# Patient Record
Sex: Female | Born: 1985 | Race: Asian | Hispanic: No | Marital: Married | Smoking: Never smoker
Health system: Southern US, Community
[De-identification: ages and names within clinical notes are randomized; demographics above are authoritative.]

---

## 2018-12-12 ENCOUNTER — Other Ambulatory Visit (HOSPITAL_COMMUNITY)
Admission: RE | Admit: 2018-12-12 | Discharge: 2018-12-12 | Disposition: A | Discharge status: Home / Self Care | Payer: 59 | Source: Ambulatory Visit | Attending: Obstetrics & Gynecology | Admitting: Obstetrics & Gynecology

## 2018-12-12 DIAGNOSIS — O3680X9 Pregnancy with inconclusive fetal viability, other fetus: Secondary | ICD-10-CM | POA: Insufficient documentation

## 2018-12-12 LAB — HCG, QUANTITATIVE, PREGNANCY: hCG, Beta Chain, Quant, S: 1524 m[IU]/mL — ABNORMAL HIGH (ref <5)

## 2019-05-31 LAB — OB RESULTS CONSOLE ABO/RH: RH Type: POSITIVE

## 2019-05-31 LAB — OB RESULTS CONSOLE GC/CHLAMYDIA
Chlamydia: NEGATIVE
Gonorrhea: NEGATIVE

## 2019-05-31 LAB — OB RESULTS CONSOLE ANTIBODY SCREEN: Antibody Screen: NEGATIVE

## 2019-05-31 LAB — OB RESULTS CONSOLE PLATELET COUNT: Platelets: 260

## 2019-05-31 LAB — HEMOGLOBIN EVAL RFX ELECTROPHORESIS: Pap: NEGATIVE

## 2019-05-31 LAB — OB RESULTS CONSOLE HGB/HCT, BLOOD
HCT: 39 (ref 29–41)
Hemoglobin: 12.5

## 2019-05-31 LAB — OB RESULTS CONSOLE HEPATITIS B SURFACE ANTIGEN: Hepatitis B Surface Ag: NEGATIVE

## 2019-05-31 LAB — OB RESULTS CONSOLE RPR: RPR: NONREACTIVE

## 2019-05-31 LAB — OB RESULTS CONSOLE HIV ANTIBODY (ROUTINE TESTING): HIV: NONREACTIVE

## 2019-05-31 LAB — OB RESULTS CONSOLE VARICELLA ZOSTER ANTIBODY, IGG: Varicella: IMMUNE

## 2019-05-31 LAB — OB RESULTS CONSOLE RUBELLA ANTIBODY, IGM: Rubella: IMMUNE

## 2019-09-29 LAB — OB RESULTS CONSOLE RPR: RPR: NONREACTIVE

## 2019-09-29 LAB — OB RESULTS CONSOLE HIV ANTIBODY (ROUTINE TESTING): HIV: NONREACTIVE

## 2019-09-29 LAB — OB RESULTS CONSOLE HGB/HCT, BLOOD
HCT: 33 (ref 29–41)
Hemoglobin: 10.7

## 2019-09-29 LAB — OB RESULTS CONSOLE PLATELET COUNT: Platelets: 259

## 2019-09-30 DIAGNOSIS — O24410 Gestational diabetes mellitus in pregnancy, diet controlled: Secondary | ICD-10-CM

## 2019-09-30 HISTORY — DX: Gestational diabetes mellitus in pregnancy, diet controlled: O24.410

## 2019-11-04 ENCOUNTER — Ambulatory Visit: Payer: 59 | Admitting: Registered"

## 2019-11-10 ENCOUNTER — Other Ambulatory Visit: Payer: Self-pay

## 2019-11-10 ENCOUNTER — Encounter: Payer: 59 | Attending: Obstetrics and Gynecology | Admitting: Dietician

## 2019-11-10 ENCOUNTER — Encounter: Payer: Self-pay | Admitting: Dietician

## 2019-11-10 DIAGNOSIS — R7309 Other abnormal glucose: Secondary | ICD-10-CM | POA: Diagnosis present

## 2019-11-10 NOTE — Patient Instructions (Signed)
   Stick to no more than 3-4 servings of carbohydrates per meal and between 0-2 servings of carbohydrates per snack.   Continue to eat small frequent meals/snacks throughout the day.   Always include a source of protein with meals/snacks.

## 2019-11-10 NOTE — Progress Notes (Signed)
Diabetes Self-Management Education  Visit Type: First/Initial  Appt. Start Time: 2:30pm  Appt. End Time: 3:00pm  11/10/2019  [name], identified by name and date of birth, is a 33 y.o. female with a diagnosis of Diabetes: Gestational Diabetes.    ASSESSMENT  Diabetes Self-Management Education - 11/10/19 1614      Visit Information   Visit Type  First/Initial      Initial Visit   Diabetes Type  Gestational Diabetes    Are you currently following a meal plan?  No    Are you taking your medications as prescribed?  Not on Medications    Date Diagnosed  09/30/2019      Health Coping   How would you rate your overall health?  Good      Psychosocial Assessment   Patient Belief/Attitude about Diabetes  Motivated to manage diabetes    Self-care barriers  None    Self-management support  Doctor's office;Family    Other persons present  Patient;Spouse/SO    Patient Concerns  Nutrition/Meal planning;Healthy Lifestyle;Glycemic Control    Special Needs  None    Preferred Learning Style  No preference indicated    Learning Readiness  Ready      Complications   How often do you check your blood sugar?  3-4 times/day      Individualized Goals (developed by patient)   Nutrition  Follow meal plan discussed    Medications  Not Applicable    Monitoring   test my blood glucose as discussed      Outcomes   Expected Outcomes  Demonstrated interest in learning. Expect positive outcomes    Future DMSE  2 wks       Individualized Plan for Diabetes Self-Management Training:   Learning Objective:  Patient will have a greater understanding of diabetes self-management. Patient education plan is to attend individual and/or group sessions per assessed needs and concerns.   Plan:  Stick to no more than 3-4 servings of carbohydrates per meal and between 0-2 servings of carbohydrates per snack.  Continue to eat small frequent meals/snacks throughout the day.  Always include a source  of protein with meals/snacks.   Expected Outcomes:  Demonstrated interest in learning. Expect positive outcomes  Education material provided: Nutrition & Gestational Diabetes packet, blood glucose log  If problems or questions, patient to contact team via:  Phone and Email  Future DSME appointment: 2 wks

## 2019-11-16 ENCOUNTER — Telehealth: Payer: Self-pay | Admitting: Family Medicine

## 2019-11-16 NOTE — Telephone Encounter (Signed)
Received a call from the husband of Emily Webb. He stated she had to transfer to this office from [facility] due to being high risk. After calling [facility], I was informed she was discharged due to being non compliant. Called patient back, and spoke with her instructing her to keep her scheduled NDM-NUTRI DIAB appointment. She was scheduled with the first available New OB appointment. She stated she understood.

## 2019-11-24 ENCOUNTER — Encounter: Payer: Self-pay | Admitting: *Deleted

## 2019-11-24 DIAGNOSIS — O0990 Supervision of high risk pregnancy, unspecified, unspecified trimester: Secondary | ICD-10-CM | POA: Insufficient documentation

## 2019-11-24 DIAGNOSIS — O24419 Gestational diabetes mellitus in pregnancy, unspecified control: Secondary | ICD-10-CM | POA: Insufficient documentation

## 2019-11-26 ENCOUNTER — Encounter: Payer: Self-pay | Admitting: Dietician

## 2019-11-26 ENCOUNTER — Telehealth: Payer: 59 | Admitting: Dietician

## 2019-11-26 ENCOUNTER — Encounter: Payer: 59 | Attending: Obstetrics and Gynecology | Admitting: Dietician

## 2019-11-26 DIAGNOSIS — R7309 Other abnormal glucose: Secondary | ICD-10-CM

## 2019-11-26 NOTE — Progress Notes (Signed)
Diabetes Self-Management Education  Visit Type: Follow-up  MyChart Visit  11/26/2019  [name], identified by name and date of birth, is a 34 y.o. female with a diagnosis of Diabetes: Gestational Diabetes.   ASSESSMENT   Diabetes Self-Management Education - 11/26/19 1123      Visit Information   Visit Type  Follow-up      Psychosocial Assessment   Patient Belief/Attitude about Diabetes  Motivated to manage diabetes    Patient Concerns  Nutrition/Meal planning    Special Needs  None      Complications   How often do you check your blood sugar?  3-4 times/day    Fasting Blood glucose range (mg/dL)  08-144    Postprandial Blood glucose range (mg/dL)  81-856      Dietary Intake   Breakfast  breakfast cookies + milk    Snack (morning)  1 slice bread + egg + apple    Lunch  1/2 - 3/4 cup rice + protein (fish or meat) + vegetables    Snack (afternoon)  tortilla chips    Dinner  1/2 - 3/4 cups rice + protein (fish or meat) + vegetables    Beverage(s)  milk, hot tea, water      Exercise   Exercise Type  Light (walking / raking leaves)   walks for 1 hour after lunch   How many days per week to you exercise?  7    How many minutes per day do you exercise?  45    Total minutes per week of exercise  315      Individualized Goals (developed by patient)   Nutrition  Follow meal plan discussed   3 carb choices at breakfast; 3-4 carb choices per lunch/dinner; <2 carb choices per snack   Physical Activity  Exercise 5-7 days per week   walking   Monitoring   test my blood glucose as discussed   fasting; after each meal     Outcomes   Expected Outcomes  Demonstrated interest in learning. Expect positive outcomes    Future DMSE  PRN    Program Status  Completed      Subsequent Visit   Since your last visit, are you checking your blood glucose at least once a day?  Yes       Individualized Plan for Diabetes Self-Management Training:  Learning Objective:  Patient will  have a greater understanding of diabetes self-management. Patient education plan is to attend individual and/or group sessions per assessed needs and concerns.   Expected Outcomes:  Demonstrated interest in learning. Expect positive outcomes  If problems or questions, patient to contact team via:  Phone and Email  Future DSME appointment: PRN

## 2019-12-02 ENCOUNTER — Ambulatory Visit (INDEPENDENT_AMBULATORY_CARE_PROVIDER_SITE_OTHER): Payer: 59 | Admitting: Obstetrics and Gynecology

## 2019-12-02 ENCOUNTER — Other Ambulatory Visit: Payer: Self-pay

## 2019-12-02 ENCOUNTER — Encounter: Payer: Self-pay | Admitting: Obstetrics and Gynecology

## 2019-12-02 ENCOUNTER — Encounter: Payer: Self-pay | Admitting: *Deleted

## 2019-12-02 ENCOUNTER — Telehealth: Payer: Self-pay | Admitting: Obstetrics and Gynecology

## 2019-12-02 VITALS — BP 120/88 | HR 101 | Wt 143.0 lb

## 2019-12-02 DIAGNOSIS — O24419 Gestational diabetes mellitus in pregnancy, unspecified control: Secondary | ICD-10-CM | POA: Diagnosis not present

## 2019-12-02 DIAGNOSIS — Z113 Encounter for screening for infections with a predominantly sexual mode of transmission: Secondary | ICD-10-CM

## 2019-12-02 DIAGNOSIS — O34219 Maternal care for unspecified type scar from previous cesarean delivery: Secondary | ICD-10-CM | POA: Diagnosis not present

## 2019-12-02 DIAGNOSIS — Z3A36 36 weeks gestation of pregnancy: Secondary | ICD-10-CM

## 2019-12-02 DIAGNOSIS — O0993 Supervision of high risk pregnancy, unspecified, third trimester: Secondary | ICD-10-CM | POA: Diagnosis not present

## 2019-12-02 DIAGNOSIS — O0990 Supervision of high risk pregnancy, unspecified, unspecified trimester: Secondary | ICD-10-CM

## 2019-12-02 NOTE — Telephone Encounter (Signed)
The patient stated she would like Dr. Jolayne Panther to deliver her as that is her primary doctor. Informed the patient I would send a message to nursing staff to discuss the process of delivery and give an definite answer of who will deliver her.

## 2019-12-02 NOTE — Progress Notes (Signed)
Patient reports occasional heart palpitations over the past month

## 2019-12-02 NOTE — Progress Notes (Signed)
   PRENATAL VISIT NOTE  Subjective:  Emily Webb is a 34 y.o. G3P0010 at [redacted]w[redacted]d being seen today for ongoing prenatal care. Patient is transferring care form [facility] due to non compliance with GDM. She is currently monitored for the following issues for this high-risk pregnancy and has Supervision of high risk pregnancy, antepartum; Gestational diabetes mellitus (GDM); and Previous cesarean section complicating pregnancy on their problem list.  Patient reports no complaints.  Contractions: Irregular. Vag. Bleeding: None.  Movement: Present. Denies leaking of fluid.   The following portions of the patient's history were reviewed and updated as appropriate: allergies, current medications, past family history, past medical history, past social history, past surgical history and problem list.   Objective:   Vitals:   12/02/19 0852  BP: 120/88  Pulse: (!) 101  Weight: 143 lb (64.9 kg)    Fetal Status: Fetal Heart Rate (bpm): 152 Fundal Height: 37 cm Movement: Present     General:  Alert, oriented and cooperative. Patient is in no acute distress.  Skin: Skin is warm and dry. No rash noted.   Cardiovascular: Normal heart rate noted  Respiratory: Normal respiratory effort, no problems with respiration noted  Abdomen: Soft, gravid, appropriate for gestational age.  Pain/Pressure: Present     Pelvic: Cervical exam performed Dilation: Closed Effacement (%): Thick Station: Ballotable  Extremities: Normal range of motion.  Edema: None  Mental Status: Normal mood and affect. Normal behavior. Normal judgment and thought content.   Assessment and Plan:  Pregnancy: G3P0010 at [redacted]w[redacted]d 1. Previous cesarean section complicating pregnancy Patient undecided on TOLAC- consent form given to patient to discuss further with her husband Previous c-section in [place] as an elective primary c-section- patient has op note which was reviewed. Patient explained that due to political instability in her region  and long distance to hospital, a shared decision was made for a planned scheduled c-section  2. Supervision of high risk pregnancy, antepartum Patient is doing well Cultures today  Growth ultrasound ordered - Culture, beta strep (group b only) - Cervicovaginal ancillary only( [place]) - [place] MFM OB DETAIL +14 WK; Future  3. Gestational diabetes mellitus (GDM) in third trimester, gestational diabetes method of control unspecified CBGs reviewed and great majority within range. One fasting 96 but patient admits to eating a cookie prior to checking CBG and one pp as high as 158 and patient admits to dietary indiscretions Continue diet control  Preterm labor symptoms and general obstetric precautions including but not limited to vaginal bleeding, contractions, leaking of fluid and fetal movement were reviewed in detail with the patient. Please refer to After Visit Summary for other counseling recommendations.   Return in about 1 week (around 12/09/2019) for in person, ROB, High risk.  No future appointments.  Catalina Antigua, MD

## 2019-12-03 LAB — CERVICOVAGINAL ANCILLARY ONLY
Chlamydia: NEGATIVE
Comment: NEGATIVE
Comment: NORMAL
Neisseria Gonorrhea: NEGATIVE

## 2019-12-06 LAB — CULTURE, BETA STREP (GROUP B ONLY): Strep Gp B Culture: NEGATIVE

## 2019-12-08 ENCOUNTER — Telehealth: Payer: 59 | Admitting: Dietician

## 2019-12-09 ENCOUNTER — Encounter: Payer: Self-pay | Admitting: Obstetrics & Gynecology

## 2019-12-09 ENCOUNTER — Ambulatory Visit (INDEPENDENT_AMBULATORY_CARE_PROVIDER_SITE_OTHER): Payer: 59 | Admitting: Obstetrics & Gynecology

## 2019-12-09 ENCOUNTER — Other Ambulatory Visit: Payer: Self-pay

## 2019-12-09 VITALS — BP 127/88 | HR 90 | Wt 145.5 lb

## 2019-12-09 DIAGNOSIS — O0993 Supervision of high risk pregnancy, unspecified, third trimester: Secondary | ICD-10-CM | POA: Diagnosis not present

## 2019-12-09 DIAGNOSIS — O0990 Supervision of high risk pregnancy, unspecified, unspecified trimester: Secondary | ICD-10-CM

## 2019-12-09 DIAGNOSIS — O24419 Gestational diabetes mellitus in pregnancy, unspecified control: Secondary | ICD-10-CM | POA: Diagnosis not present

## 2019-12-09 DIAGNOSIS — O34219 Maternal care for unspecified type scar from previous cesarean delivery: Secondary | ICD-10-CM

## 2019-12-09 DIAGNOSIS — Z3A37 37 weeks gestation of pregnancy: Secondary | ICD-10-CM | POA: Diagnosis not present

## 2019-12-09 NOTE — Progress Notes (Signed)
   PRENATAL VISIT NOTE  Subjective:  Emily Webb is a 34 y.o. G3P0011 at [redacted]w[redacted]d being seen today for ongoing prenatal care.  She is currently monitored for the following issues for this high-risk pregnancy and has Supervision of high risk pregnancy, antepartum; Gestational diabetes mellitus (GDM); and Previous cesarean section complicating pregnancy on their problem list.  Patient reports no complaints.  Contractions: Irregular. Vag. Bleeding: None.  Movement: Present. Denies leaking of fluid.   The following portions of the patient's history were reviewed and updated as appropriate: allergies, current medications, past family history, past medical history, past social history, past surgical history and problem list.   Objective:   Vitals:   12/09/19 1522  BP: 127/88  Pulse: 90  Weight: 145 lb 8 oz (66 kg)    Fetal Status: Fetal Heart Rate (bpm): 149   Movement: Present     General:  Alert, oriented and cooperative. Patient is in no acute distress.  Skin: Skin is warm and dry. No rash noted.   Cardiovascular: Normal heart rate noted  Respiratory: Normal respiratory effort, no problems with respiration noted  Abdomen: Soft, gravid, appropriate for gestational age.  Pain/Pressure: Present     Pelvic: Cervical exam deferred        Extremities: Normal range of motion.  Edema: Trace  Mental Status: Normal mood and affect. Normal behavior. Normal judgment and thought content.   Assessment and Plan:  Pregnancy: G3P0011 at [redacted]w[redacted]d There are no diagnoses linked to this encounter. Term labor symptoms and general obstetric precautions including but not limited to vaginal bleeding, contractions, leaking of fluid and fetal movement were reviewed in detail with the patient. Please refer to After Visit Summary for other counseling recommendations.  Elects repeat CS 39 weeks, requested for 12/17/19 Return in about 1 week (around 12/16/2019).  Future Appointments  Date Time Provider Department  Center  12/14/2019  2:30 PM WH-[facility] [place] 1 WH-MFCUS M[facility]-US  12/14/2019  2:40 PM WH-MFC NURSE WH-MFC MFC-US  12/16/2019  3:15 PM Constant, Gigi Gin, MD [facility]OC[facility]A WOC  12/27/2019  8:15 AM WOC-[facility]OCA NST [facility]A WOC  12/27/2019  9:15 AM Adam Phenix, MD [facility]    Scheryl Darter, MD

## 2019-12-09 NOTE — Patient Instructions (Signed)
AREA PEDIATRIC/FAMILY PRACTICE PHYSICIANS  [place] ([address]) . [facility] o Chambliss, MD; Eniola, MD; Hale, MD; Hensel, MD; McDiarmid, MD; McIntyer, MD; Neal, MD; Walden, MD o [address] [place], [place] [address] o [phone] o Mon-Fri 8:30-12:30, 1:30-5:00 o Providers come to see babies at [facility] o Accepting Medicaid . [facility] o Limited providers who accept newborns: Koirala, MD; Morrow, MD; Wolters, MD o [address], [place], [place] [address] o [phone] o Mon-Fri 8:00-5:30 o Babies seen by providers at [facility] o Does NOT accept Medicaid o Please call early in hospitalization for appointment (limited availability)  . [facility] o Mulberry, MD o [address] [place], [place] [address] o [phone] o Mon, Tue, Thur, Fri 8:30-5:00, Wed 10:00-7:00 (closed 1-2pm) o Babies seen by [facility] providers o Accepting Medicaid . [facility] o Rubin, MD o [address], [place], [place] [address] o [phone] o Mon-Fri 8:30-5:00, Sat 8:30-12:00 o Provider comes to see babies at [facility] o Accepting Medicaid o Must have been referred from current patients or contacted office prior to delivery . [facility] ([facility]) o Brown, MD; Chandler, MD; Ettefagh, MD; Grant, MD; Lester, MD; McCormick, MD; McQueen, MD; Prose, MD; Simha, MD; Stanley, MD; Stryffeler, NP; Tebben, NP o [address], [place], [place] [address] o [phone] o Mon, Tue, Thur, Fri 8:30-5:30, Wed 9:30-5:30, Sat 8:30-12:30 o Babies seen by [facility] providers o Accepting Medicaid o Only accepting infants of first-time parents or siblings of current patients o Hospital discharge coordinator will make follow-up appointment . Jack Amos o [address],  [place], [place]  [address] o [phone]   Fax - [phone] . [facility] o [address], [place], [place]  [address] o Phone - [phone]   Fax - [phone] . Shilpa Gosrani o [address], [place], [place]  [address] o [phone]  [place] ([address]) . [facility] o Bates, MD; Brassfield, MD; Cooper, Cox, MD; MD; Davis, MD; Dovico, MD; Ettefaugh, MD; Little, MD; Lowe, MD; Keiffer, MD; Melvin, MD; Sumner, MD; Williams, MD o [address], [place], [place] [address] o [phone] o Mon-Fri 8:30-5:00 (extended evenings Mon-Thur as needed), Sat-Sun 10:00-1:00 o Providers come to see babies at [facility] o Accepting Medicaid for families of first-time babies and families with all children in the household age 3 and under. Must register with office prior to making appointment (M-F only). . [facility] o Henson, NP; Knapp, MD; Lalonde, MD; Tysinger, PA o [address] [place], [place] [address] o [phone] o Mon-Fri 8:00-5:00 o Babies seen by providers at [facility] o Does NOT accept Medicaid/Commercial Insurance Only . [facility] ([facility]  o Artis, MD; Barnes, MD; Bratton, MD; Coccaro, MD; Lockett Gardner, MD; Kramer, MD; Marshall, MD; Netherton, MD; Poleto, MD; Skinner, MD o [address] [place],  [address] o [phone] o Mon-Fri 8:30-5:30, Sat (Oct.-Mar.) 9:00-1:00 o Babies seen by providers at [facility] o Accepting Medicaid  [place] ([address]) . [facility] o Reid, MD; Warner, MD o [address], ,  [address] o [phone] o Mon-Fri 8:30-5:00, Sat 8:30-12:00 o Providers come to see babies at [facility] o Does NOT accept Medicaid . [facility] o Becker, PA; Hagler, MD; Scifres, PA; Sun, MD; Swayne, MD o [address],  [place], [place] [address] o [phone] o Mon-Fri 8:00-5:00 o Babies seen by providers at [facility] o Does NOT accept Medicaid o Only accepting babies of parents who are patients o Please call early in hospitalization for appointment (limited availability) . [facility] o Clark, MD; Frye, MD; Kelleher, MD; Mack, NP; Miller, MD; O'Keller, MD; Patterson, NP; Pudlo, MD; Puzio, MD; Thomas, MD; Tucker, MD; Twiselton, MD o [address], [place], [place] [address] o [phone] o Mon-Fri 8:00-5:00, Sat 9:00-12:00 o Providers come to see babies at [facility] o Does NOT accept Medicaid  [place] ([address]) . [facility] o Limited providers accepting new patients: Brake, NP; Wharton, PA o [address], [place], [place] [address] o [phone] o Mon-Fri 8:00-5:00 o Babies seen by providers at [facility] o Does NOT accept Medicaid o Only accepting babies of parents who are patients o Please call early in hospitalization for appointment (limited availability) . [facility] o Gay, MD; Quinlan, MD o [address] [place], [place] [address] o [phone] (press 1 to schedule appointment) o Mon-Fri 8:00-5:00 o Providers come to see babies at [facility] o Does NOT accept Medicaid . [facility] o Mazer, MD o [address] [place], [place] [address] o [phone] o Mon-Fri 8:30-5:00 (lunch 12:30-1:00), extended hours by appointment only Wed 5:00-6:30 o Babies seen by [facility] providers o Accepting Medicaid . [facility] o Banks, MD; Jordan, MD; Koberlein, MD o [address], [place], [place] [address] o [phone] o Mon-Fri 8:00-5:00 o Babies seen by [facility] providers o Does NOT accept Medicaid .  [facility] o Parker, MD; Hunter, MD; Wallace, DO o [address] [place], [place]  [address] o [phone] o Mon-Fri 8:00-5:00 o Babies seen by [facility] providers o Does NOT accept Medicaid . [facility] o Brandon, PA; Brecken, PA; Christy, NP; Dees, MD; DeClaire, MD; DeWeese, MD; Hansen, NP; Mills, NP; Parrish, NP; Smoot, NP; Summer, MD; Vapne, MD o [address] [place], [place] [address] o [phone] o Mon-Fri 8:30-5:00, Sat 10:00-1:00 o Providers come to see babies at [facility] o Does NOT accept Medicaid o Free prenatal information session Tuesdays at 4:45pm . [facility] o Bouska, MD; Gordon, PA; Jeffery, PA; Weber, PA o [address] [place] [address] o [phone] o Mon-Fri 7:30-5:30 o Babies seen by [facility] providers . [facility] o [address], [place], [place]  [address] o [phone]   Fax - [phone]  [place] ([address]) . [facility] o Reese, MD o [address] [place], [place] [address] o [phone] o Mon-Thur 8:00-6:00 o Providers come to see babies at [facility] o Accepting Medicaid . [facility] o Anderson, NP; Badger, MD; Beal, PA; Spencer, PA o [address] [place], [place] [address] o [phone] o Mon-Thur 7:30-7:30, Fri 7:30-4:30 o Babies seen by [facility] providers o Accepting Medicaid . [facility] o Agbuya, MD; Klett, NP; Romgoolam, MD o [address], [place], [place] [address] o [phone] o Mon-Fri 8:30-5:00, Sat 8:30-12:00 o Providers come to see babies at [facility] o Accepting Medicaid o Must have "Meet & Greet" appointment at office prior to delivery . [facility] ([facility] o McCord,   MD; Wallace, MD; Wood, MD o [address], [place], [place] [address] o [phone] o Mon-Wed 8:00-6:00, Thur-Fri 8:00-5:00, Sat 9:00-12:00 o Providers come to  see babies at [facility] o Does NOT accept Medicaid o Only accepting siblings of current patients . [facility]  o [address], [place], [place]  [address] o [phone]   Fax - [phone] . [facility] o [address], [place], [place]  [address] o [phone]   Fax - [phone]  [place] ([address]) . [facility] o Cirigliano, DO; Matthews, DO o [address] [place], [place] [address] o [phone] o Mon-Fri 7:00-5:00 o Babies seen by [facility] providers o Does NOT accept Medicaid . [facility] o Briscoe, MD; Howley, PA; Moreira, PA o [address], [place], [place] [address] o [phone] o Mon-Fri 8:00-5:00 o Babies seen by [facility] providers o Accepting Medicaid . [facility] o Boyd, MD; Church, PA; Jones, NP; Osborn, PA o [address], [place], [place] [address] o [phone] o Mon-Fri 8:00-5:00 o Babies seen by providers at [facility] o Accepting Medicaid  [place] ([address]) . [facility] [place] o Wendling, DO o [address] [place], [place] [address] o [phone] o Mon-Fri 8:00-5:00 o Babies seen by [facility] providers o Does NOT accept Medicaid o Limited availability, please call early in hospitalization to schedule follow-up . [facility] o Calderon, PA; Cummings, MD; Dillard, MD; Martin, PA; Olson, MD; VanDeven, PA o [address], [place], [place] [address] o [phone] o Mon-Fri 8:30-5:00, Sat 9:00-12:00 o Babies seen by providers at [facility] o Accepting Medicaid o Please register online then schedule online or call office o [email or web address] . [facility] ([facility] o Hunter, NP; Kumar, MD; Martin Rogers, PA o [address], [place], [place] [address] o [phone] o Mon-Fri 8:00-5:00 o Babies seen by providers at [facility] o Accepting Medicaid . [facility] ([facility] o Huron, MD; Kristi Fleenor, NP; West, MD o [address], [place], [place] [address] o [phone] o Mon-Fri 8:00-5:30, Sat&Sun by appointment (phones open at 8:30) o Babies seen by [facility] providers o Accepting Medicaid o Must be a first-time baby or sibling of current patient . [facility] [place]  o [address], [place], [place]  [address] o [phone]   Fax - [phone]  [place] ([address]) . [facility] o Brown, PA; Cowen, PA; Rice, MD; Helton, PA; Spry, MD o [address] [place], [place] [address] o [phone] o Mon-Thur 8:00-7:00, Fri 8:00-5:00, Sat 8:00-12:00, Sun 9:00-12:00 o Babies seen by [facility] providers o Accepting Medicaid . [facility] o Coe-Goins, MD; Marshall, MD; Pierre-Louis, MD o [address], [place], [place] [address] o [phone] o Mon-Thur 8:00-5:00 o Babies seen by providers at [facility] o Accepting Medicaid . [facility] o Bratton, MD; Coe-Goins, MD; Hayes, MD; Lewis, MD; List, MD; Lott, MD; Marshall, MD; Moran, MD; O'Neal, MD; Pierre-Louis, MD; Pitonzo, MD; Scholer, MD; Spangle, MD o [address] [place], [place]   [address] o [phone] o Mon-Fri 8:00-5:30, Sat (Oct.-Mar.) 9:00-1:00 o Babies seen by providers at [facility] o Accepting Medicaid o Must fill out new patient packet, available online at [email or web address] . [facility] ([facility] o Friddle, NP; Harris, NP; Kelly, NP; Logan, MD;  Melvin, PA; Poth, MD; Ramadoss, MD; Stanton, NP o [address], [place], [place] [address] o [phone] o Mon-Thur 8:00-5:30, Fri 8:00-5:00 o Babies seen by providers at [facility] o Accepting Medicaid  [place] ([address]) . [facility] o Dixon, PA; [place], MD; Pickard, MD; Tapia, PA o [address], [place], [place] [address] o [phone] o Mon-Fri 8:00-5:00 o Babies seen by providers at [facility] o Accepting Medicaid   [place] ([address]) . [facility] [place] o Masneri, DO; Meyers, MD; Nelson, PA o [address], [place], [place] [address] o [phone] o Mon-Fri 8:00-5:00 o Babies seen by providers at [facility] o Does NOT accept Medicaid o Limited appointment availability, please call early in hospitalization  . [facility] [place] o Kunedd, DO; McGowen, MD o [address], [place], [place] [address] o [phone] o Mon-Fri 8:00-5:00 o Babies seen by [facility] providers o Does NOT accept Medicaid . [facility] [place] o Cameron, MD; MacDonald, MD; Michaels, PA; Nayak, MD o [address] [place] [address], [place], [place] [address] o [phone] o Mon-Fri 8:00-5:00 o After hours clinic ([address] [place], [place] [address]) [phone] Mon-Fri 5:00-8:00, Sat 12:00-6:00, Sun 10:00-4:00 o Babies seen by [facility] providers o Accepting Medicaid . [facility] [place] o [address], [place], [place]  [address] o [phone]   Fax - [phone]  [place] ([address]) . [facility] [place] [facility] o Andy, MD o [address], [place], [place] [address] o [phone] o Mon-Fri 8:00-5:00 o Babies seen by [facility] providers o Does NOT accept Medicaid . [facility] [place] ([facility] [place]) o Eksir, MD o [address], [place], [place]  [address] o [phone] o Mon-Thur 8:00-7:00, Fri 8:00-5:00, Sat 8:00-12:00 o Babies seen by providers at [facility] o Accepting Medicaid - but does not have vaccinations in office (must be received elsewhere) o Limited availability, please call early in hospitalization  [place] ([address]) . [facility]  o Charlene Flemming, MD o [address], [place]  [address] o [phone]  Fax [phone]   

## 2019-12-10 ENCOUNTER — Telehealth (HOSPITAL_COMMUNITY): Payer: Self-pay | Admitting: *Deleted

## 2019-12-10 NOTE — Telephone Encounter (Signed)
Preadmission screen  

## 2019-12-10 NOTE — Patient Instructions (Signed)
Deya Pembleton  12/10/2019   Your procedure is scheduled on:  12/17/2019  Arrive at 0730 at [employer] [facility] on [employer] at [facility]  and [employer]. You are invited to use the FREE valet parking or use the Visitor's parking deck.  Pick up the phone at the desk and dial [phone].  Call this number if you have problems the morning of surgery: [phone]  Remember:   Do not eat food:(After Midnight) Desps de medianoche.  Do not drink clear liquids: (After Midnight) Desps de medianoche.  Take these medicines the morning of surgery with A SIP OF WATER:  none   Do not wear jewelry, make-up or nail polish.  Do not wear lotions, powders, or perfumes. Do not wear deodorant.  Do not shave 48 hours prior to surgery.  Do not bring valuables to the hospital.  [facility] is not   responsible for any belongings or valuables brought to the hospital.  Contacts, dentures or bridgework may not be worn into surgery.  Leave suitcase in the car. After surgery it may be brought to your room.  For patients admitted to the hospital, checkout time is 11:00 AM the day of              discharge.      Please read over the following fact sheets that you were given:     Preparing for Surgery

## 2019-12-13 ENCOUNTER — Encounter (HOSPITAL_COMMUNITY): Payer: Self-pay

## 2019-12-13 ENCOUNTER — Telehealth: Payer: Self-pay | Admitting: Obstetrics and Gynecology

## 2019-12-13 NOTE — Telephone Encounter (Signed)
Patient is calling to ask questions about her C-section on the 29th.

## 2019-12-13 NOTE — Telephone Encounter (Signed)
Called pt and pt asked since Dr. Jolayne Panther did her c-section can she do her babies circumcision.  I advised pt that may have been a possibility while she was in the hospital but now that she is discharged she I advised pt to contact her babies doctor.  Pt reports that her baby doctor is Associate Professor.  I informed pt that she should call their office to find out what she needs to do to get baby circumcised.  Pt verbalized understanding.   Addison Naegeli, RN 12/13/19

## 2019-12-14 ENCOUNTER — Ambulatory Visit (HOSPITAL_BASED_OUTPATIENT_CLINIC_OR_DEPARTMENT_OTHER)
Admission: RE | Admit: 2019-12-14 | Discharge: 2019-12-14 | Disposition: A | Discharge status: Home / Self Care | Payer: 59 | Source: Ambulatory Visit | Attending: Obstetrics and Gynecology | Admitting: Obstetrics and Gynecology

## 2019-12-14 ENCOUNTER — Other Ambulatory Visit: Payer: Self-pay | Admitting: Obstetrics and Gynecology

## 2019-12-14 ENCOUNTER — Other Ambulatory Visit: Payer: Self-pay

## 2019-12-14 ENCOUNTER — Encounter (HOSPITAL_COMMUNITY): Payer: Self-pay | Admitting: *Deleted

## 2019-12-14 ENCOUNTER — Ambulatory Visit (HOSPITAL_COMMUNITY): Payer: 59 | Admitting: *Deleted

## 2019-12-14 DIAGNOSIS — O24419 Gestational diabetes mellitus in pregnancy, unspecified control: Secondary | ICD-10-CM | POA: Insufficient documentation

## 2019-12-14 DIAGNOSIS — O0990 Supervision of high risk pregnancy, unspecified, unspecified trimester: Secondary | ICD-10-CM

## 2019-12-14 DIAGNOSIS — O24410 Gestational diabetes mellitus in pregnancy, diet controlled: Secondary | ICD-10-CM

## 2019-12-14 DIAGNOSIS — O34219 Maternal care for unspecified type scar from previous cesarean delivery: Secondary | ICD-10-CM | POA: Insufficient documentation

## 2019-12-14 DIAGNOSIS — Z3A38 38 weeks gestation of pregnancy: Secondary | ICD-10-CM | POA: Diagnosis not present

## 2019-12-15 ENCOUNTER — Other Ambulatory Visit (HOSPITAL_COMMUNITY)
Admission: RE | Admit: 2019-12-15 | Discharge: 2019-12-15 | Disposition: A | Discharge status: Home / Self Care | Payer: 59 | Source: Ambulatory Visit | Attending: Obstetrics & Gynecology | Admitting: Obstetrics & Gynecology

## 2019-12-15 DIAGNOSIS — Z01812 Encounter for preprocedural laboratory examination: Secondary | ICD-10-CM | POA: Insufficient documentation

## 2019-12-15 DIAGNOSIS — Z20822 Contact with and (suspected) exposure to covid-19: Secondary | ICD-10-CM | POA: Insufficient documentation

## 2019-12-15 LAB — CBC
HCT: 37.2 % (ref 36.0–46.0)
Hemoglobin: 11.9 g/dL — ABNORMAL LOW (ref 12.0–15.0)
MCH: 30.6 pg (ref 26.0–34.0)
MCHC: 32 g/dL (ref 30.0–36.0)
MCV: 95.6 fL (ref 80.0–100.0)
Platelets: 199 10*3/uL (ref 150–400)
RBC: 3.89 MIL/uL (ref 3.87–5.11)
RDW: 14.3 % (ref 11.5–15.5)
WBC: 10 10*3/uL (ref 4.0–10.5)
nRBC: 0 % (ref 0.0–0.2)

## 2019-12-15 LAB — SARS CORONAVIRUS 2 (TAT 6-24 HRS): SARS Coronavirus 2: NEGATIVE

## 2019-12-15 LAB — TYPE AND SCREEN
ABO/RH(D): A POS
Antibody Screen: NEGATIVE

## 2019-12-15 LAB — ABO/RH: ABO/RH(D): A POS

## 2019-12-15 NOTE — MAU Note (Signed)
Pt here for PAT covid swab. Denies symptoms and sick contacts. Swab collected. Pt verbalizes understanding to not remove blood bank bracelet.

## 2019-12-16 ENCOUNTER — Telehealth (INDEPENDENT_AMBULATORY_CARE_PROVIDER_SITE_OTHER): Payer: 59 | Admitting: Obstetrics and Gynecology

## 2019-12-16 ENCOUNTER — Encounter: Payer: Self-pay | Admitting: Obstetrics and Gynecology

## 2019-12-16 VITALS — BP 134/88 | HR 90

## 2019-12-16 DIAGNOSIS — O24419 Gestational diabetes mellitus in pregnancy, unspecified control: Secondary | ICD-10-CM

## 2019-12-16 DIAGNOSIS — Z3A38 38 weeks gestation of pregnancy: Secondary | ICD-10-CM | POA: Diagnosis not present

## 2019-12-16 DIAGNOSIS — O34219 Maternal care for unspecified type scar from previous cesarean delivery: Secondary | ICD-10-CM | POA: Diagnosis not present

## 2019-12-16 DIAGNOSIS — O0993 Supervision of high risk pregnancy, unspecified, third trimester: Secondary | ICD-10-CM

## 2019-12-16 DIAGNOSIS — O0990 Supervision of high risk pregnancy, unspecified, unspecified trimester: Secondary | ICD-10-CM

## 2019-12-16 LAB — RPR: RPR Ser Ql: NONREACTIVE

## 2019-12-16 NOTE — Progress Notes (Signed)
I connected with  Emily Webb on 12/16/19 at 1521 by telephone and verified that I am speaking with the correct person using two identifiers.   I discussed the limitations, risks, security and privacy concerns of performing an evaluation and management service by telephone and the availability of in person appointments. I also discussed with the patient that there may be a patient responsible charge related to this service. The patient expressed understanding and agreed to proceed.  Marjo Bicker, RN 12/16/2019  3:21 PM

## 2019-12-16 NOTE — Progress Notes (Signed)
TELEHEALTH OBSTETRICS PRENATAL VIRTUAL VIDEO VISIT ENCOUNTER NOTE  Provider location: [facility] for [facility] [place]   I connected with Emily Webb on 12/16/19 at  3:15 PM EST by MyChart Video Encounter at home and verified that I am speaking with the correct person using two identifiers.   I discussed the limitations, risks, security and privacy concerns of performing an evaluation and management service virtually and the availability of in person appointments. I also discussed with the patient that there may be a patient responsible charge related to this service. The patient expressed understanding and agreed to proceed. Subjective:  Emily Webb is a 34 y.o. G3P0011 at [redacted]w[redacted]d being seen today for ongoing prenatal care.  She is currently monitored for the following issues for this high-risk pregnancy and has Supervision of high risk pregnancy, antepartum; Gestational diabetes mellitus (GDM); and Previous cesarean section complicating pregnancy on their problem list.  Patient reports no complaints.  Contractions: Irregular. Vag. Bleeding: None.  Movement: Present. Denies any leaking of fluid.   The following portions of the patient's history were reviewed and updated as appropriate: allergies, current medications, past family history, past medical history, past social history, past surgical history and problem list.   Objective:   Vitals:   12/16/19 1523 12/16/19 1535  BP: (!) 138/91 134/88  Pulse: (!) 105 90    Fetal Status:     Movement: Present     General:  Alert, oriented and cooperative. Patient is in no acute distress.  Respiratory: Normal respiratory effort, no problems with respiration noted  Mental Status: Normal mood and affect. Normal behavior. Normal judgment and thought content.  Rest of physical exam deferred due to type of encounter  Imaging: [place] MFM OB DETAIL +14 WK  Result Date:  12/14/2019 ----------------------------------------------------------------------  OBSTETRICS REPORT                       (Signed Final 12/14/2019 04:46 pm) ---------------------------------------------------------------------- Patient Info  ID #:       [id number]                          D.O.B.:  [birth year] (33 yrs)  Name:       Emily Webb                   Visit Date: 12/14/2019 02:30 pm ---------------------------------------------------------------------- Performed By  Performed By:     Hurman Horn          Ref. Address:     Faculty                    RDMS  Attending:        Ma Rings MD         [facility]  Referred By:      Catalina Antigua MD ---------------------------------------------------------------------- Orders   #  Description  Code         Ordered By   1  [place] MFM OB DETAIL +14 [place]              [id number]     Happy  ----------------------------------------------------------------------   #  Order #                    Accession #                 Episode #   1  [id number]                  [id number]                  [id number]  ---------------------------------------------------------------------- Indications   Gestational diabetes in pregnancy, diet        O24.410   controlled   [redacted] weeks gestation of pregnancy                Z3A.38   Encounter for antenatal screening for          Z36.3   malformations  ---------------------------------------------------------------------- Vital Signs  Weight (lb): 145                               Height:        5'1"  BMI:         27.39 ---------------------------------------------------------------------- Fetal Evaluation  Num Of Fetuses:         1  Fetal Heart Rate(bpm):  146  Cardiac Activity:       Observed  Presentation:           Cephalic  Placenta:               Anterior  P. Cord Insertion:      Visualized, central   Amniotic Fluid  AFI FV:      Subjectively low-normal  AFI Sum(cm)     %Tile       Largest Pocket(cm)  6.95            4           3.88  RUQ(cm)                     LUQ(cm)        LLQ(cm)  1.67                        3.88           1.4 ---------------------------------------------------------------------- Biometry  BPD:      91.4  mm     G. Age:  37w 1d         38  %    CI:        77.02   %    70 - 86                                                          FL/HC:      21.2   %    20.6 - 23.4  HC:      329.8  mm     G. Age:  37w 4d  14  %    HC/AC:      0.92        0.87 - 1.06  AC:      357.6  mm     G. Age:  39w 5d         91  %    FL/BPD:     76.5   %    71 - 87  FL:       69.9  mm     G. Age:  35w 6d          4  %    FL/AC:      19.5   %    20 - 24  HUM:      61.4  mm     G. Age:  35w 4d         19  %  Est. FW:    3449  gm    7 lb 10 oz      59  % ---------------------------------------------------------------------- OB History  Gravidity:    3         Term:   1         SAB:   1 ---------------------------------------------------------------------- Gestational Age  LMP:           38w 4d        Date:  03/19/19                 EDD:   12/24/19  U/S Today:     37w 4d                                        EDD:   12/31/19  Best:          38w 4d     Det. By:  LMP  (03/19/19)          EDD:   12/24/19 ---------------------------------------------------------------------- Anatomy  Cranium:               Appears normal         Aortic Arch:            Not well visualized  Cavum:                 Appears normal         Ductal Arch:            Not well visualized  Ventricles:            Not well visualized    Diaphragm:              Appears normal  Choroid Plexus:        Not well visualized    Stomach:                Appears normal, left                                                                        sided  Cerebellum:            Not well visualized    Abdomen:  Appears normal  Posterior Fossa:        Not well visualized    Abdominal Wall:         Not well visualized  Nuchal Fold:           Not well visualized    Cord Vessels:           Not well visualized  Face:                  Not well visualized    Kidneys:                Appear normal  Lips:                  Not well visualized    Bladder:                Appears normal  Thoracic:              Appears normal         Spine:                  Appears normal  Heart:                 Not well visualized    Upper Extremities:      RUE nml; LUE nws  RVOT:                  Appears normal         Lower Extremities:      Appears normal  LVOT:                  Appears normal  Other:  Technically difficult due to advanced GA and fetal position. ---------------------------------------------------------------------- Comments  This patient was seen for an an ultrasound as she recently  transferred her care to the faculty practice and due to diet-  controlled gestational diabetes.  She reports that her  fingerstick values have mostly been within normal limits.  She was informed that the fetal growth and amniotic fluid  level appears appropriate for her gestational age.  The patient reports that she already has a cesarean delivery  scheduled in 3 days. ----------------------------------------------------------------------                   Ma Rings, MD Electronically Signed Final Report   12/14/2019 04:46 pm ----------------------------------------------------------------------   Assessment and Plan:  Pregnancy: G3P0011 at [redacted]w[redacted]d 1. Supervision of high risk pregnancy, antepartum Patient is doing well without complaints  2. Previous cesarean section complicating pregnancy Patient is scheduled for repeat c-section tomorrow Answered question regarding c-section and postpartum care  3. Gestational diabetes mellitus (GDM) in third trimester, gestational diabetes method of control unspecified Patient reports all CBGs readings are within range  Term labor symptoms  and general obstetric precautions including but not limited to vaginal bleeding, contractions, leaking of fluid and fetal movement were reviewed in detail with the patient. I discussed the assessment and treatment plan with the patient. The patient was provided an opportunity to ask questions and all were answered. The patient agreed with the plan and demonstrated an understanding of the instructions. The patient was advised to call back or seek an in-person office evaluation/go to MAU at [facility] for any urgent or concerning symptoms. Please refer to After Visit Summary for other counseling recommendations.   I provided 15 minutes of face-to-face time during this encounter.  Return in about 6 weeks (  around 01/27/2020) for postpartum.  Future Appointments  Date Time Provider Department Center  12/27/2019  8:15 AM WOC-WOCA NST [facility]A WOC  12/27/2019  9:15 AM Adam Phenix, MD [facility]    Catalina Antigua, MD Center for [facility]

## 2019-12-17 ENCOUNTER — Encounter (HOSPITAL_COMMUNITY)
Admission: AD | Disposition: A | Discharge status: Home / Self Care | Payer: Self-pay | Source: Home / Self Care | Attending: Obstetrics and Gynecology

## 2019-12-17 ENCOUNTER — Inpatient Hospital Stay (HOSPITAL_COMMUNITY): Admission: RE | Admit: 2019-12-17 | Payer: 59 | Source: Home / Self Care | Admitting: Obstetrics and Gynecology

## 2019-12-17 ENCOUNTER — Encounter (HOSPITAL_COMMUNITY): Payer: Self-pay | Admitting: Obstetrics and Gynecology

## 2019-12-17 ENCOUNTER — Other Ambulatory Visit: Payer: Self-pay

## 2019-12-17 ENCOUNTER — Inpatient Hospital Stay (HOSPITAL_COMMUNITY): Payer: 59 | Admitting: Anesthesiology

## 2019-12-17 ENCOUNTER — Inpatient Hospital Stay (HOSPITAL_COMMUNITY)
Admission: AD | Admit: 2019-12-17 | Discharge: 2019-12-19 | DRG: 788 | Disposition: A | Discharge status: Home / Self Care | Payer: 59 | Attending: Obstetrics and Gynecology | Admitting: Obstetrics and Gynecology

## 2019-12-17 DIAGNOSIS — Z01812 Encounter for preprocedural laboratory examination: Secondary | ICD-10-CM | POA: Diagnosis not present

## 2019-12-17 DIAGNOSIS — O34219 Maternal care for unspecified type scar from previous cesarean delivery: Secondary | ICD-10-CM

## 2019-12-17 DIAGNOSIS — Z98891 History of uterine scar from previous surgery: Secondary | ICD-10-CM

## 2019-12-17 DIAGNOSIS — Z20822 Contact with and (suspected) exposure to covid-19: Secondary | ICD-10-CM | POA: Diagnosis present

## 2019-12-17 DIAGNOSIS — Z3A39 39 weeks gestation of pregnancy: Secondary | ICD-10-CM

## 2019-12-17 DIAGNOSIS — O34211 Maternal care for low transverse scar from previous cesarean delivery: Principal | ICD-10-CM | POA: Diagnosis present

## 2019-12-17 DIAGNOSIS — O24420 Gestational diabetes mellitus in childbirth, diet controlled: Secondary | ICD-10-CM | POA: Diagnosis present

## 2019-12-17 DIAGNOSIS — O24419 Gestational diabetes mellitus in pregnancy, unspecified control: Secondary | ICD-10-CM | POA: Diagnosis present

## 2019-12-17 LAB — GLUCOSE, CAPILLARY
Glucose-Capillary: 74 mg/dL (ref 70–99)
Glucose-Capillary: 72 mg/dL (ref 70–99)

## 2019-12-17 SURGERY — Surgical Case
Anesthesia: Spinal | Wound class: Clean Contaminated

## 2019-12-17 MED ORDER — ENOXAPARIN SODIUM 40 MG/0.4ML ~~LOC~~ SOLN
40.0000 mg | SUBCUTANEOUS | Status: DC
  Administered 2019-12-18 – 2019-12-19 (×2): 40 mg via SUBCUTANEOUS
  Filled 2019-12-17 (×2): qty 0.4

## 2019-12-17 MED ORDER — DEXAMETHASONE SODIUM PHOSPHATE 4 MG/ML IJ SOLN
INTRAMUSCULAR | Status: DC | PRN
  Administered 2019-12-17: 4 mg via INTRAVENOUS

## 2019-12-17 MED ORDER — LACTATED RINGERS IV SOLN: INTRAVENOUS | Status: DC

## 2019-12-17 MED ORDER — NALBUPHINE HCL 10 MG/ML IJ SOLN: 5.0000 mg | INTRAMUSCULAR | Status: DC | PRN

## 2019-12-17 MED ORDER — OXYCODONE HCL 5 MG/5ML PO SOLN: 5.0000 mg | Freq: Once | ORAL | Status: DC | PRN

## 2019-12-17 MED ORDER — SCOPOLAMINE 1 MG/3DAYS TD PT72
MEDICATED_PATCH | TRANSDERMAL | Status: AC
  Filled 2019-12-17: qty 1

## 2019-12-17 MED ORDER — IBUPROFEN 800 MG PO TABS
800.0000 mg | ORAL_TABLET | Freq: Three times a day (TID) | ORAL | Status: DC
  Administered 2019-12-17 – 2019-12-19 (×7): 800 mg via ORAL
  Filled 2019-12-17 (×8): qty 1

## 2019-12-17 MED ORDER — TETANUS-DIPHTH-ACELL PERTUSSIS 5-2.5-18.5 LF-MCG/0.5 IM SUSP
0.5000 mL | Freq: Once | INTRAMUSCULAR | Status: DC
  Filled 2019-12-17: qty 0.5

## 2019-12-17 MED ORDER — SIMETHICONE 80 MG PO CHEW
80.0000 mg | CHEWABLE_TABLET | ORAL | Status: DC | PRN
  Filled 2019-12-17: qty 1

## 2019-12-17 MED ORDER — WITCH HAZEL-GLYCERIN EX PADS: 1.0000 "application " | MEDICATED_PAD | CUTANEOUS | Status: DC | PRN

## 2019-12-17 MED ORDER — OXYCODONE HCL 5 MG PO TABS: 5.0000 mg | ORAL_TABLET | Freq: Once | ORAL | Status: DC | PRN

## 2019-12-17 MED ORDER — CEFAZOLIN SODIUM-DEXTROSE 2-3 GM-%(50ML) IV SOLR
INTRAVENOUS | Status: DC | PRN
  Administered 2019-12-17: 2 g via INTRAVENOUS

## 2019-12-17 MED ORDER — MENTHOL 3 MG MT LOZG
1.0000 | LOZENGE | OROMUCOSAL | Status: DC | PRN
  Filled 2019-12-17: qty 9

## 2019-12-17 MED ORDER — NALBUPHINE HCL 10 MG/ML IJ SOLN: 5.0000 mg | Freq: Once | INTRAMUSCULAR | Status: DC | PRN

## 2019-12-17 MED ORDER — ONDANSETRON HCL 4 MG/2ML IJ SOLN: 4.0000 mg | Freq: Three times a day (TID) | INTRAMUSCULAR | Status: DC | PRN

## 2019-12-17 MED ORDER — DIPHENHYDRAMINE HCL 25 MG PO CAPS: 25.0000 mg | ORAL_CAPSULE | ORAL | Status: DC | PRN

## 2019-12-17 MED ORDER — OXYTOCIN 40 UNITS IN NORMAL SALINE INFUSION - SIMPLE MED
INTRAVENOUS | Status: DC | PRN
  Administered 2019-12-17: 40 mL via INTRAVENOUS

## 2019-12-17 MED ORDER — ONDANSETRON HCL 4 MG/2ML IJ SOLN: 4.0000 mg | Freq: Once | INTRAMUSCULAR | Status: DC | PRN

## 2019-12-17 MED ORDER — FENTANYL CITRATE (PF) 100 MCG/2ML IJ SOLN: 25.0000 ug | INTRAMUSCULAR | Status: DC | PRN

## 2019-12-17 MED ORDER — OXYTOCIN 40 UNITS IN NORMAL SALINE INFUSION - SIMPLE MED
INTRAVENOUS | Status: AC
  Filled 2019-12-17: qty 1000

## 2019-12-17 MED ORDER — PHENYLEPHRINE HCL-NACL 20-0.9 MG/250ML-% IV SOLN
INTRAVENOUS | Status: DC | PRN
  Administered 2019-12-17: 60 ug/min via INTRAVENOUS

## 2019-12-17 MED ORDER — LACTATED RINGERS IV SOLN: INTRAVENOUS | Status: DC | PRN

## 2019-12-17 MED ORDER — FENTANYL CITRATE (PF) 100 MCG/2ML IJ SOLN
INTRAMUSCULAR | Status: AC
  Filled 2019-12-17: qty 2

## 2019-12-17 MED ORDER — EPHEDRINE SULFATE 50 MG/ML IJ SOLN
INTRAMUSCULAR | Status: DC | PRN
  Administered 2019-12-17: 10 mg via INTRAVENOUS
  Administered 2019-12-17 (×2): 5 mg via INTRAVENOUS

## 2019-12-17 MED ORDER — SODIUM CHLORIDE 0.9% FLUSH: 3.0000 mL | INTRAVENOUS | Status: DC | PRN

## 2019-12-17 MED ORDER — DIPHENHYDRAMINE HCL 50 MG/ML IJ SOLN: 12.5000 mg | INTRAMUSCULAR | Status: DC | PRN

## 2019-12-17 MED ORDER — SIMETHICONE 80 MG PO CHEW
80.0000 mg | CHEWABLE_TABLET | Freq: Three times a day (TID) | ORAL | Status: DC
  Administered 2019-12-17 – 2019-12-19 (×5): 80 mg via ORAL
  Filled 2019-12-17 (×6): qty 1

## 2019-12-17 MED ORDER — DIBUCAINE (PERIANAL) 1 % EX OINT
1.0000 "application " | TOPICAL_OINTMENT | CUTANEOUS | Status: DC | PRN
  Filled 2019-12-17: qty 28

## 2019-12-17 MED ORDER — DEXAMETHASONE SODIUM PHOSPHATE 4 MG/ML IJ SOLN
INTRAMUSCULAR | Status: AC
  Filled 2019-12-17: qty 1

## 2019-12-17 MED ORDER — MORPHINE SULFATE (PF) 0.5 MG/ML IJ SOLN
INTRAMUSCULAR | Status: DC | PRN
  Administered 2019-12-17: .15 mg via INTRATHECAL

## 2019-12-17 MED ORDER — SENNOSIDES-DOCUSATE SODIUM 8.6-50 MG PO TABS
2.0000 | ORAL_TABLET | ORAL | Status: DC
  Administered 2019-12-18: 02:00:00 2 via ORAL
  Filled 2019-12-17 (×2): qty 2

## 2019-12-17 MED ORDER — EPHEDRINE 5 MG/ML INJ
INTRAVENOUS | Status: AC
  Filled 2019-12-17: qty 10

## 2019-12-17 MED ORDER — MORPHINE SULFATE (PF) 0.5 MG/ML IJ SOLN
INTRAMUSCULAR | Status: AC
  Filled 2019-12-17: qty 10

## 2019-12-17 MED ORDER — SODIUM CHLORIDE 0.9 % IR SOLN
Status: DC | PRN
  Administered 2019-12-17: 1000 mL

## 2019-12-17 MED ORDER — GABAPENTIN 100 MG PO CAPS
100.0000 mg | ORAL_CAPSULE | Freq: Three times a day (TID) | ORAL | Status: DC
  Administered 2019-12-17 – 2019-12-19 (×6): 100 mg via ORAL
  Filled 2019-12-17 (×6): qty 1

## 2019-12-17 MED ORDER — NALOXONE HCL 0.4 MG/ML IJ SOLN: 0.4000 mg | INTRAMUSCULAR | Status: DC | PRN

## 2019-12-17 MED ORDER — DIPHENHYDRAMINE HCL 25 MG PO CAPS: 25.0000 mg | ORAL_CAPSULE | Freq: Four times a day (QID) | ORAL | Status: DC | PRN

## 2019-12-17 MED ORDER — ONDANSETRON HCL 4 MG/2ML IJ SOLN
INTRAMUSCULAR | Status: AC
  Filled 2019-12-17: qty 2

## 2019-12-17 MED ORDER — OXYTOCIN 40 UNITS IN NORMAL SALINE INFUSION - SIMPLE MED: 2.5000 [IU]/h | INTRAVENOUS | Status: AC

## 2019-12-17 MED ORDER — BUPIVACAINE IN DEXTROSE 0.75-8.25 % IT SOLN
INTRATHECAL | Status: DC | PRN
  Administered 2019-12-17: 1.4 mL via INTRATHECAL

## 2019-12-17 MED ORDER — COCONUT OIL OIL
1.0000 "application " | TOPICAL_OIL | Status: DC | PRN
  Filled 2019-12-17: qty 120

## 2019-12-17 MED ORDER — SIMETHICONE 80 MG PO CHEW
80.0000 mg | CHEWABLE_TABLET | ORAL | Status: DC
  Administered 2019-12-18 (×2): 80 mg via ORAL
  Filled 2019-12-17 (×3): qty 1

## 2019-12-17 MED ORDER — OXYCODONE-ACETAMINOPHEN 5-325 MG PO TABS
1.0000 | ORAL_TABLET | ORAL | Status: DC | PRN
  Administered 2019-12-18 (×2): 1 via ORAL
  Filled 2019-12-17 (×2): qty 1

## 2019-12-17 MED ORDER — CEFAZOLIN SODIUM-DEXTROSE 2-4 GM/100ML-% IV SOLN: 2.0000 g | INTRAVENOUS | Status: DC

## 2019-12-17 MED ORDER — MEPERIDINE HCL 25 MG/ML IJ SOLN: 6.2500 mg | INTRAMUSCULAR | Status: DC | PRN

## 2019-12-17 MED ORDER — NALOXONE HCL 4 MG/10ML IJ SOLN
1.0000 ug/kg/h | INTRAVENOUS | Status: DC | PRN
  Filled 2019-12-17: qty 5

## 2019-12-17 MED ORDER — STERILE WATER FOR IRRIGATION IR SOLN
Status: DC | PRN
  Administered 2019-12-17: 1000 mL

## 2019-12-17 MED ORDER — SODIUM CHLORIDE 0.9 % IV SOLN: INTRAVENOUS | Status: DC | PRN

## 2019-12-17 MED ORDER — SCOPOLAMINE 1 MG/3DAYS TD PT72: 1.0000 | MEDICATED_PATCH | Freq: Once | TRANSDERMAL | Status: DC

## 2019-12-17 MED ORDER — FENTANYL CITRATE (PF) 100 MCG/2ML IJ SOLN
INTRAMUSCULAR | Status: DC | PRN
  Administered 2019-12-17: 15 ug via INTRATHECAL

## 2019-12-17 MED ORDER — PRENATAL MULTIVITAMIN CH
1.0000 | ORAL_TABLET | Freq: Every day | ORAL | Status: DC
  Administered 2019-12-18 – 2019-12-19 (×2): 1 via ORAL
  Filled 2019-12-17 (×3): qty 1

## 2019-12-17 MED ORDER — PHENYLEPHRINE HCL-NACL 20-0.9 MG/250ML-% IV SOLN
INTRAVENOUS | Status: AC
  Filled 2019-12-17: qty 250

## 2019-12-17 MED ORDER — ONDANSETRON HCL 4 MG/2ML IJ SOLN
INTRAMUSCULAR | Status: DC | PRN
  Administered 2019-12-17: 4 mg via INTRAVENOUS

## 2019-12-17 SURGICAL SUPPLY — 26 items
CHLORAPREP W/TINT 26ML (MISCELLANEOUS) ×2 IMPLANT
CLAMP CORD UMBIL (MISCELLANEOUS) IMPLANT
DRSG OPSITE POSTOP 4X10 (GAUZE/BANDAGES/DRESSINGS) ×2 IMPLANT
ELECT REM PT RETURN 9FT ADLT (ELECTROSURGICAL) ×2
ELECTRODE REM PT RTRN 9FT ADLT (ELECTROSURGICAL) ×1 IMPLANT
EXTRACTOR VACUUM M CUP 4 TUBE (SUCTIONS) IMPLANT
GLOVE BIOGEL PI IND STRL 6.5 (GLOVE) ×1 IMPLANT
GLOVE BIOGEL PI IND STRL 7.0 (GLOVE) ×1 IMPLANT
GLOVE BIOGEL PI INDICATOR 6.5 (GLOVE) ×1
GLOVE BIOGEL PI INDICATOR 7.0 (GLOVE) ×1
GLOVE SURG SS PI 6.0 STRL IVOR (GLOVE) ×2 IMPLANT
GOWN STRL REUS W/TWL LRG LVL3 (GOWN DISPOSABLE) ×4 IMPLANT
KIT ABG SYR 3ML LUER SLIP (SYRINGE) IMPLANT
NEEDLE HYPO 25X5/8 SAFETYGLIDE (NEEDLE) IMPLANT
NS IRRIG 1000ML POUR BTL (IV SOLUTION) ×2 IMPLANT
PACK C SECTION WH (CUSTOM PROCEDURE TRAY) ×2 IMPLANT
PAD OB MATERNITY 4.3X12.25 (PERSONAL CARE ITEMS) ×2 IMPLANT
PENCIL SMOKE EVAC W/HOLSTER (ELECTROSURGICAL) ×2 IMPLANT
RTRCTR C-SECT PINK 25CM LRG (MISCELLANEOUS) IMPLANT
SEPRAFILM MEMBRANE 5X6 (MISCELLANEOUS) IMPLANT
SUT PLAIN 0 NONE (SUTURE) IMPLANT
SUT VIC AB 0 CT1 36 (SUTURE) ×8 IMPLANT
SUT VIC AB 4-0 KS 27 (SUTURE) ×2 IMPLANT
TOWEL OR 17X24 6PK STRL BLUE (TOWEL DISPOSABLE) ×2 IMPLANT
TRAY FOLEY W/BAG SLVR 14FR LF (SET/KITS/TRAYS/PACK) ×2 IMPLANT
WATER STERILE IRR 1000ML POUR (IV SOLUTION) ×2 IMPLANT

## 2019-12-17 NOTE — Anesthesia Preprocedure Evaluation (Signed)
Anesthesia Evaluation  Patient identified by MRN, date of birth, ID band Patient awake    Reviewed: Allergy & Precautions, H&P , NPO status , Patient's Chart, lab work & pertinent test results  History of Anesthesia Complications Negative for: history of anesthetic complications  Airway Mallampati: II  TM Distance: >3 FB Neck ROM: full    Dental no notable dental hx.    Pulmonary neg pulmonary ROS,    Pulmonary exam normal        Cardiovascular negative cardio ROS Normal cardiovascular exam     Neuro/Psych negative neurological ROS  negative psych ROS   GI/Hepatic Neg liver ROS, GERD  ,  Endo/Other  diabetes, Gestational  Renal/GU negative Renal ROS  negative genitourinary   Musculoskeletal   Abdominal   Peds  Hematology negative hematology ROS (+)   Anesthesia Other Findings 1 prior C/S  Reproductive/Obstetrics (+) Pregnancy                             Anesthesia Physical Anesthesia Plan  ASA: II  Anesthesia Plan: Spinal   Post-op Pain Management:    Induction:   PONV Risk Score and Plan: Ondansetron and Treatment may vary due to age or medical condition  Airway Management Planned:   Additional Equipment:   Intra-op Plan:   Post-operative Plan:   Informed Consent: I have reviewed the patients History and Physical, chart, labs and discussed the procedure including the risks, benefits and alternatives for the proposed anesthesia with the patient or authorized representative who has indicated his/her understanding and acceptance.       Plan Discussed with:   Anesthesia Plan Comments:         Anesthesia Quick Evaluation

## 2019-12-17 NOTE — Discharge Instructions (Signed)

## 2019-12-17 NOTE — Anesthesia Postprocedure Evaluation (Signed)
Anesthesia Post Note  Patient: Emily Webb  Procedure(s) Performed: CESAREAN SECTION (N/A )     Patient location during evaluation: PACU Anesthesia Type: Spinal Level of consciousness: oriented and awake and alert Pain management: pain level controlled Vital Signs Assessment: post-procedure vital signs reviewed and stable Respiratory status: spontaneous breathing, respiratory function stable and nonlabored ventilation Cardiovascular status: blood pressure returned to baseline and stable Postop Assessment: no headache, no backache, no apparent nausea or vomiting and spinal receding Anesthetic complications: no    Last Vitals:  Vitals:   12/17/19 1115 12/17/19 1137  BP: 105/77 115/86  Pulse: 65 65  Resp: 20 20  Temp:  36.7 C  SpO2: 100% 100%    Last Pain:  Vitals:   12/17/19 1137  TempSrc: Oral   Pain Goal:    LLE Motor Response: Purposeful movement (12/17/19 1115) LLE Sensation: Tingling (12/17/19 1115) RLE Motor Response: Purposeful movement (12/17/19 1115) RLE Sensation: Tingling (12/17/19 1115)     Epidural/Spinal Function Cutaneous sensation: Tingles (12/17/19 1115), Patient able to flex knees: Yes (12/17/19 1115), Patient able to lift hips off bed: No (12/17/19 1115), Back pain beyond tenderness at insertion site: No (12/17/19 1115), Progressively worsening motor and/or sensory loss: No (12/17/19 1115), Bowel and/or bladder incontinence post epidural: No (12/17/19 1115)  Lucretia Kern

## 2019-12-17 NOTE — Transfer of Care (Signed)
Immediate Anesthesia Transfer of Care Note  Patient: Emily Webb  Procedure(s) Performed: CESAREAN SECTION (N/A )  Patient Location: PACU  Anesthesia Type:Spinal  Level of Consciousness: awake, alert  and oriented  Airway & Oxygen Therapy: Patient Spontanous Breathing  Post-op Assessment: Report given to RN and Post -op Vital signs reviewed and stable  Post vital signs: Reviewed and stable  Last Vitals:  Vitals Value Taken Time  BP 111/73 12/17/19 1018  Temp    Pulse 67 12/17/19 1020  Resp 22 12/17/19 1020  SpO2 100 % 12/17/19 1020  Vitals shown include unvalidated device data.  Last Pain:  Vitals:   12/17/19 0806  TempSrc: Oral         Complications: No apparent anesthesia complications

## 2019-12-17 NOTE — Lactation Note (Signed)
This note was copied from a baby's chart. Lactation Consultation Note  Patient Name: Emily Webb [id number] Date: 12/17/2019 Reason for consult: Initial assessment;Maternal endocrine disorder Type of Endocrine Disorder?: Diabetes P2, 11 hour female infant. Infant had 3 voids and 3 stools since birth. Mom's feeding choice at admission is breast and formula feeding. Mom with hx: GDM and C/S delivery. Per mom, she breastfeed her 34 year old daughter for 21/2 years and mom has medela DEBP at home. This is infant 4 time latch at breast. Per dad, infant falls asleep quickly when mom is breastfeeding, LC notice mom attempting breastfeed infant swaddle in two blankets and not STS. LC discussed with parents mom breastfeeding STS and tips to keep infant awake while breastfeeding. With parents permission infant was undress with diaper and hat left on. Mom breastfeeding infant  doing STS on right breast in side lying position with pill rolled behind infant back, mom support breast, infant nose and chin touching breast, swallows observed, infant was still breastfeeding after 11 minutes when LC left room. Per mom, she only felt a tug and not pain with latch.  Mom knows to breastfeed infant according to hunger cues, 8 to 12 times within 24 hours, on demand and not exceed 3 hours without breastfeeding infant. Mom knows to call RN pr LC if she has questions, concerns or need assistance with latching infant at breast. Reviewed Baby & Me book's Breastfeeding Basics.  Mom made aware of O/P services, breastfeeding support groups, community resources, and our phone # for post-discharge questions.   Maternal Data Formula Feeding for Exclusion: Yes Reason for exclusion: Mother's choice to formula and breast feed on admission Has patient been taught Hand Expression?: Yes Does the patient have breastfeeding experience prior to this delivery?: Yes  Feeding Feeding Type: Breast Fed  LATCH Score Latch: Grasps  breast easily, tongue down, lips flanged, rhythmical sucking.  Audible Swallowing: Spontaneous and intermittent  Type of Nipple: Everted at rest and after stimulation  Comfort (Breast/Nipple): Soft / non-tender  Hold (Positioning): Assistance needed to correctly position infant at breast and maintain latch.  LATCH Score: 9  Interventions Interventions: Breast feeding basics reviewed;Breast compression;Adjust position;Assisted with latch;Skin to skin;Support pillows;DEBP;Position options;Breast massage  Lactation Tools Discussed/Used WIC Program: No   Consult Status Consult Status: Follow-up Date: 12/18/19 Follow-up type: In-patient    Danelle Earthly 12/17/2019, 8:58 PM

## 2019-12-17 NOTE — H&P (Signed)
Emily Webb is a 35 y.o. female G3P1011 at [redacted]w[redacted]d presenting for scheduled elective repeat cesarean section. Patient reports feeling well with good fetal movement and irregular contractions. Patient denies vaginal bleeding or leakage of fluid. Patient with prenatal care at [facility] (transferred from [facility]) complicated by GDM well controlled on diet.   OB History    Gravida  3   Para  1   Term  0   Preterm      AB  1   Living  1     SAB  1   TAB      Ectopic      Multiple      Live Births             Past Medical History:  Diagnosis Date  . Abnormal GTT (glucose tolerance test) 09/30/2019   Past Surgical History:  Procedure Laterality Date  . CESAREAN SECTION  10/18/2012   Family History: family history includes Heart disease in her father. Social History:  reports that she has never smoked. She has never used smokeless tobacco. She reports that she does not drink alcohol or use drugs.     Maternal Diabetes: Yes:  Diabetes Type:  Diet controlled Genetic Screening: Normal Maternal Ultrasounds/Referrals: Normal Fetal Ultrasounds or other Referrals:  None Maternal Substance Abuse:  No Significant Maternal Medications:  None Significant Maternal Lab Results:  Group B Strep negative Other Comments:  None  Review of Systems  See pertinent in HPI History   Blood pressure (!) 130/96, temperature 98.1 F (36.7 C), temperature source Oral, resp. rate 18, height 5\' 1"  (1.549 m), weight 65.8 kg, last menstrual period 03/19/2019, SpO2 100 %. Exam Physical Exam  GENERAL: Well-developed, well-nourished female in no acute distress.  LUNGS: Clear to auscultation bilaterally.  HEART: Regular rate and rhythm. ABDOMEN: Soft, nontender, nondistended. No organomegaly. PELVIC: Notindicated EXTREMITIES: No cyanosis, clubbing, or edema, 2+ distal pulses.  Prenatal labs: ABO, Rh: --/--/A POS, A POS Performed at [facility], [address], [place], [place]  [place]  [phone]) Antibody: NEG (01/27 0959) Rubella: Immune (07/13 0000) RPR: NON REACTIVE (01/27 0959)  HBsAg: Negative (07/13 0000)  HIV: Non-reactive (11/11 0000)  GBS: Negative/-- (01/14 0859)   Assessment/Plan: 34 yo G3P1011 at 39 weeks here for scheduled elective repeat c-section - Risks, benefits and alternatives were explained including but not limited to risks of bleeding, infection and damage to adjacent organs. Patient verbalized understanding and all questions were answered - Patient remain undecided on contraception choice - Patient desires inpatient circumcision   Hassell Patras 12/17/2019, 9:03 AM

## 2019-12-17 NOTE — Anesthesia Procedure Notes (Signed)
Spinal  Patient location during procedure: OR Staffing Performed: anesthesiologist  Anesthesiologist: Latera Mclin E, MD Preanesthetic Checklist Completed: patient identified, IV checked, risks and benefits discussed, surgical consent, monitors and equipment checked, pre-op evaluation and timeout performed Spinal Block Patient position: sitting Prep: DuraPrep and site prepped and draped Patient monitoring: continuous pulse ox, blood pressure and heart rate Approach: midline Location: L3-4 Injection technique: single-shot Needle Needle type: Pencan  Needle gauge: 24 G Needle length: 9 cm Additional Notes Functioning IV was confirmed and monitors were applied. Sterile prep and drape, including hand hygiene and sterile gloves were used. The patient was positioned and the spine was prepped. The skin was anesthetized with lidocaine.  Free flow of clear CSF was obtained prior to injecting local anesthetic into the CSF. The needle was carefully withdrawn. The patient tolerated the procedure well.      

## 2019-12-17 NOTE — Discharge Summary (Signed)
Postpartum Discharge Summary     Patient Name: Emily Webb DOB: [birth year] MRN: [id number]  Date of admission: 12/17/2019 Delivering Provider: CONSTANT, PEGGY   Date of discharge: 12/19/2019  Admitting diagnosis: S/P cesarean section [Z98.891] Intrauterine pregnancy: [redacted]w[redacted]d    Secondary diagnosis:  Principal Problem:   Previous cesarean section complicating pregnancy Active Problems:   Gestational diabetes mellitus (GDM)   S/P cesarean section  Additional problems: None     Discharge diagnosis: Term Pregnancy Delivered                                                                                                Post partum procedures:None  Augmentation: None  Complications: None  Hospital course:  Sceduled C/S   34y.o. yo G3P1012 at 317w0das admitted to the hospital 12/17/2019 for scheduled cesarean section with the following indication:Elective Repeat.  Membrane Rupture Time/Date: 9:40 AM ,12/17/2019   Patient delivered a Viable infant.12/17/2019  Details of operation can be found in separate operative note.  Pateint had an uncomplicated postpartum course. Declined birth control. She is ambulating, tolerating a regular diet, passing flatus, and urinating well. Patient is discharged home in stable condition on  12/19/19        Delivery time: 9:40 AM    Magnesium Sulfate received: No BMZ received: No Rhophylac:N/A MMR:N/A Transfusion:No  Physical exam  Vitals:   12/18/19 0821 12/18/19 1420 12/18/19 2115 12/19/19 0507  BP: 101/62 110/70 115/85 131/90  Pulse: (!) 59 81 (!) 8 68  Resp: '17 16 18 18  ' Temp: 98.2 F (36.8 C) 98.3 F (36.8 C) 98.5 F (36.9 C) 98.4 F (36.9 C)  TempSrc: Oral Oral Oral Oral  SpO2:    100%  Weight:      Height:       General: alert, cooperative and no distress Lochia: appropriate Uterine Fundus: firm Incision: Healing well with no significant drainage, No significant erythema, Dressing is clean, dry, and intact DVT Evaluation: No  evidence of DVT seen on physical exam. Labs: Lab Results  Component Value Date   WBC 12.6 (H) 12/18/2019   HGB 10.3 (L) 12/18/2019   HCT 31.8 (L) 12/18/2019   MCV 94.1 12/18/2019   PLT 159 12/18/2019   CMP Latest Ref Rng & Units 12/18/2019  Creatinine 0.44 - 1.00 mg/dL 0.56    Discharge instruction: per After Visit Summary and "Baby and Me Booklet".  After visit meds:  Allergies as of 12/19/2019   No Known Allergies     Medication List    STOP taking these medications   ferrous sulfate 325 (65 FE) MG tablet     TAKE these medications   ibuprofen 800 MG tablet Commonly known as: ADVIL Take 1 tablet (800 mg total) by mouth every 8 (eight) hours.   omeprazole 20 MG capsule Commonly known as: PRILOSEC Take 20 mg by mouth daily.   oxyCODONE-acetaminophen 5-325 MG tablet Commonly known as: PERCOCET/ROXICET Take 1-2 tablets by mouth every 4 (four) hours as needed for moderate pain.   prenatal multivitamin Tabs tablet Take 1 tablet by mouth daily at 12 noon.  senna-docusate 8.6-50 MG tablet Commonly known as: Senokot-S Take 2 tablets by mouth daily. Start taking on: December 20, 2019       Diet: routine diet  Activity: Advance as tolerated. Pelvic rest for 6 weeks.   Outpatient follow up:4 weeks Follow up Appt:No future appointments. Follow up Visit: Please schedule this patient for Postpartum visit in: 4 weeks with the following provider: Any provider For C/S patients schedule nurse incision check in weeks 2 weeks: yes High risk pregnancy complicated by: GDM Delivery mode:  CS Anticipated Birth Control:  unsure PP Procedures needed: Incision check, 2 hour GTT Schedule Integrated BH visit: no  Newborn Data: Live born female  Birth Weight: 3135g APGAR (1 MIN): 7   APGAR (5 MINS): 7   APGAR (10 MINS):    Newborn Delivery   Birth date/time: 12/17/2019 09:40:00 Delivery type: C-Section, Low Transverse Trial of labor: No C-section categorization: Repeat       Baby Feeding: Breast Disposition:home with mother   12/19/2019 Chauncey Mann, MD

## 2019-12-17 NOTE — Op Note (Signed)
Emily Webb PROCEDURE DATE: 12/17/2019  PREOPERATIVE DIAGNOSIS: Intrauterine pregnancy at  [redacted]w[redacted]d weeks gestation; patient declines vag del attempt  POSTOPERATIVE DIAGNOSIS: The same  PROCEDURE:     Cesarean Section  SURGEON:  Dr. Catalina Antigua  ASSISTANT: Dr. Salomon Mast  INDICATIONS: Emily Webb is a 34 y.o. [id number] at [redacted]w[redacted]d scheduled for cesarean section secondary to patient declines vag del attempt.  The risks of cesarean section discussed with the patient included but were not limited to: bleeding which may require transfusion or reoperation; infection which may require antibiotics; injury to bowel, bladder, ureters or other surrounding organs; injury to the fetus; need for additional procedures including hysterectomy in the event of a life-threatening hemorrhage; placental abnormalities wth subsequent pregnancies, incisional problems, thromboembolic phenomenon and other postoperative/anesthesia complications. The patient concurred with the proposed plan, giving informed written consent for the procedure.    FINDINGS:  Viable female infant in cephalic presentation.  Apgars 8 and 8.  Clear amniotic fluid.  Intact placenta, three vessel cord.  Normal uterus, fallopian tubes and ovaries bilaterally.  ANESTHESIA:    Spinal INTRAVENOUS FLUIDS:1300 ml ESTIMATED BLOOD LOSS: 489 ml URINE OUTPUT:  200 ml SPECIMENS: Placenta sent to L&D COMPLICATIONS: None immediate  PROCEDURE IN DETAIL:  The patient received intravenous antibiotics and had sequential compression devices applied to her lower extremities while in the preoperative area.  She was then taken to the operating room where anesthesia was induced and was found to be adequate. A foley catheter was placed into her bladder and attached to Kaedyn Belardo gravity. She was then placed in a dorsal supine position with a leftward tilt, and prepped and draped in a sterile manner. After an adequate timeout was performed, a Pfannenstiel skin incision was  made with scalpel and carried through to the underlying layer of fascia. The fascia was incised in the midline and this incision was extended bilaterally using the Mayo scissors. Kocher clamps were applied to the superior aspect of the fascial incision and the underlying rectus muscles were dissected off bluntly. A similar process was carried out on the inferior aspect of the facial incision. The rectus muscles were separated in the midline bluntly and the peritoneum was entered bluntly. The Alexis self-retaining retractor was introduced into the abdominal cavity. Attention was turned to the lower uterine segment where a transverse hysterotomy was made with a scalpel and extended bilaterally bluntly. The infant was successfully delivered and delayed cord clamping was performed for 1 minute. The cord was clamped and cut and infant was handed over to awaiting neonatology team. Uterine massage was then administered and the placenta delivered intact with three-vessel cord. The uterus was cleared of clot and debris.  The hysterotomy was closed with 0 Vicryl in a running locked fashion, and an imbricating layer was also placed with a 0 Vicryl. Overall, excellent hemostasis was noted. The pelvis copiously irrigated and cleared of all clot and debris. Hemostasis was confirmed on all surfaces.  The peritoneum and the muscles were reapproximated using 0 vicryl interrupted stitches. The fascia was then closed using 0 Vicryl in a running fashion.  The skin was closed in a subcuticular fashion using 3.0 Vicryl. The patient tolerated the procedure well. Sponge, lap, instrument and needle counts were correct x 2. She was taken to the recovery room in stable condition.    Kiegan Macaraeg ConstantMD  12/17/2019 10:06 AM

## 2019-12-18 LAB — CREATININE, SERUM
Creatinine, Ser: 0.56 mg/dL (ref 0.44–1.00)
GFR calc Af Amer: >60 mL/min (ref >60)
GFR calc non Af Amer: >60 mL/min (ref >60)

## 2019-12-18 LAB — CBC
HCT: 31.8 % — ABNORMAL LOW (ref 36.0–46.0)
Hemoglobin: 10.3 g/dL — ABNORMAL LOW (ref 12.0–15.0)
MCH: 30.5 pg (ref 26.0–34.0)
MCHC: 32.4 g/dL (ref 30.0–36.0)
MCV: 94.1 fL (ref 80.0–100.0)
Platelets: 159 10*3/uL (ref 150–400)
RBC: 3.38 MIL/uL — ABNORMAL LOW (ref 3.87–5.11)
RDW: 13.9 % (ref 11.5–15.5)
WBC: 12.6 10*3/uL — ABNORMAL HIGH (ref 4.0–10.5)
nRBC: 0 % (ref 0.0–0.2)

## 2019-12-18 LAB — GLUCOSE, CAPILLARY: Glucose-Capillary: 91 mg/dL (ref 70–99)

## 2019-12-18 NOTE — Progress Notes (Signed)
Subjective: Postpartum Day 1: Cesarean Delivery Patient reports feeling well. Tolerating PO. Some uterine cramping. Lochia appropriate. Breastfeeding. Has not ambulated yet. Foley catheter still in place.  Objective: Vital signs in last 24 hours: Temp:  [97.4 F (36.3 C)-98.2 F (36.8 C)] 98.2 F (36.8 C) (01/30 0455) Pulse Rate:  [54-72] 56 (01/30 0455) Resp:  [16-21] 16 (01/30 0455) BP: (96-130)/(60-96) 96/60 (01/30 0455) SpO2:  [97 %-100 %] 97 % (01/30 0455) Weight:  [65.8 kg] 65.8 kg (01/29 0806)  Physical Exam:  General: alert, cooperative and appears stated age Lochia: appropriate Uterine Fundus: firm Incision: healing well, no significant drainage DVT Evaluation: No evidence of DVT seen on physical exam.  Recent Labs    12/15/19 0959  HGB 11.9*  HCT 37.2    Assessment/Plan: Status post Cesarean section. Doing well postoperatively.  Continue current care. Pending AM CBC Vitals stable Undecided about birth control; possibly Nexplanon (has [employer]) Breastfeeding Desires inpatient circumcision Plan for DC POD#2 or 3  Trista Ciocca N Keatin Benham 12/18/2019, 6:18 AM

## 2019-12-19 MED ORDER — IBUPROFEN 800 MG PO TABS: 800.0000 mg | ORAL_TABLET | Freq: Three times a day (TID) | ORAL | 0 refills | Status: AC

## 2019-12-19 MED ORDER — SENNOSIDES-DOCUSATE SODIUM 8.6-50 MG PO TABS: 2.0000 | ORAL_TABLET | ORAL | 0 refills | Status: DC

## 2019-12-19 MED ORDER — OXYCODONE-ACETAMINOPHEN 5-325 MG PO TABS: 1.0000 | ORAL_TABLET | ORAL | 0 refills | Status: DC | PRN

## 2019-12-19 NOTE — Progress Notes (Signed)
CSW received consult for Edinburgh 15. CSW met with MOB to offer support and complete assessment.    MOB sitting up in bed with infant asleep next to her and FOB present at bedside, when CSW entered the room. CSW introduced self and received verbal permission to complete assessment with FOB present. MOB and FOB both very pleasant and easy to engage throughout assessment. CSW inquired about if MOB had any mental health history and MOB denied. CSW addressed Edinburgh Score of 15 which FOB shared MOB had an increase in sadness towards the end of her pregnancy and concern for wellbeing of infant. FOB also shared that during their previous pregnancy they were in [place] home country and had plenty of family around to offer support but this time it's just FOB and MOB. FOB did state that his mother has been here due to COVID and will be able to offer support once they get home. CSW asked MOB about her answer of "1" to question 10 regarding thoughts of self harm. MOB denied any past or current thoughts of self harm. MOB also denied any previous PPD/A. CSW provided education regarding the baby blues period vs. perinatal mood disorders, discussed treatment and gave resources for mental health follow up if concerns arise. CSW recommended self-evaluation during the postpartum time period using the New Mom Checklist from Postpartum Progress and encouraged MOB to contact a medical professional if symptoms are noted at any time. MOB did not appear to be displaying any acute mental health symptoms and denied any current SI or HI.   MOB and FOB confirmed having all essential items for infant once discharged. CSW inquired about where infant would be sleeping once home and FOB shared they would try out having infant in the bed with them and see how it goes. CSW provided review of Sudden Infant Death Syndrome (SIDS) precautions, safe sleeping habits and the risks of co-sleeping. MOB and FOB expressed understanding and FOB reported he  would try to find something for infant to sleep in. CSW explained that a Baby Box could be provided for them prior to discharge and FOB was very appreciative.   CSW identifies no further need for intervention and no barriers to discharge at this time.  Korie Brabson, LCSW [facility] [phone] 

## 2019-12-19 NOTE — Lactation Note (Signed)
This note was copied from a baby's chart. Lactation Consultation Note  Patient Name: Emily Webb Agent [id number] Date: 12/19/2019  P2, 43 hour female infant, -4% . Per parents, infant has cluster feed throughout the night and most feedings are 15 minutes. Infant breastfeed for 15 minutes prior to [facility] entering room, LC reviewed hand expression with mom and infant was given 3 mls of colostrum by spoon. Mom will continue to breastfeed infant by hunger cues, on demand and not exceed 3 hours without breastfeeding infant. Mom knows to call RN or LC if she has any questions, concerns or need any latch assistance with infant.    Maternal Data    Feeding Feeding Type: Breast Milk  LATCH Score                   Interventions    Lactation Tools Discussed/Used     Consult Status      Danelle Earthly 12/19/2019, 5:11 AM

## 2019-12-22 ENCOUNTER — Telehealth: Payer: Self-pay

## 2019-12-22 NOTE — Telephone Encounter (Signed)
Pt left VM requesting call back to discuss questions about wound care, including removing the current dressing, and ibuprofen. Per chart review pt is s/p c-section on 12/17/19.  Called pt; message received stating VM box has not been set up.

## 2019-12-24 ENCOUNTER — Inpatient Hospital Stay (HOSPITAL_COMMUNITY): Admit: 2019-12-24 | Payer: 59 | Admitting: Obstetrics and Gynecology

## 2019-12-24 ENCOUNTER — Encounter (HOSPITAL_COMMUNITY): Payer: Self-pay

## 2019-12-24 SURGERY — Surgical Case
Anesthesia: Spinal

## 2019-12-27 ENCOUNTER — Other Ambulatory Visit: Payer: 59

## 2019-12-27 ENCOUNTER — Encounter: Payer: 59 | Admitting: Obstetrics & Gynecology

## 2019-12-27 NOTE — Telephone Encounter (Signed)
Called pt to follow up on pt's VM from last week concerning c-section. Pt states the pain related to her incision has decreased and her incision continues to heal. Pt is no longer needing ibuprofen for pain; encouraged pt to take as needed. Pt reports a headache beginning yesterday with neck pain. Complains of numbness to left arm and decreased grip strength in left hand. Continued BLL edema since c-section. Instructed pt to take BP. BP is 153/101; pt immediately rechecked, BP 145/96. Instructed pt to go to MAU for further evaluation. Pt asked if I would speak to her husband over the phone. Spoke with husband about need for pt to go directly to MAU. Explained they can expect her BP to be checked multiple times, blood and urine labs, and possibly medication for her HA and BP. Husband and pt express intent to go to MAU. Called MAU with report.   Fleet Contras RN 12/27/19

## 2019-12-30 ENCOUNTER — Ambulatory Visit (INDEPENDENT_AMBULATORY_CARE_PROVIDER_SITE_OTHER): Payer: 59

## 2019-12-30 ENCOUNTER — Other Ambulatory Visit: Payer: Self-pay

## 2019-12-30 ENCOUNTER — Inpatient Hospital Stay (HOSPITAL_COMMUNITY)
Admission: AD | Admit: 2019-12-30 | Discharge: 2020-01-01 | DRG: 776 | Disposition: A | Discharge status: Home / Self Care | Payer: 59 | Attending: Obstetrics and Gynecology | Admitting: Obstetrics and Gynecology

## 2019-12-30 ENCOUNTER — Encounter (HOSPITAL_COMMUNITY): Payer: Self-pay | Admitting: Obstetrics and Gynecology

## 2019-12-30 VITALS — BP 152/118

## 2019-12-30 DIAGNOSIS — R7401 Elevation of levels of liver transaminase levels: Secondary | ICD-10-CM

## 2019-12-30 DIAGNOSIS — O1415 Severe pre-eclampsia, complicating the puerperium: Secondary | ICD-10-CM | POA: Diagnosis present

## 2019-12-30 DIAGNOSIS — Z20822 Contact with and (suspected) exposure to covid-19: Secondary | ICD-10-CM | POA: Diagnosis present

## 2019-12-30 DIAGNOSIS — Z0130 Encounter for examination of blood pressure without abnormal findings: Secondary | ICD-10-CM

## 2019-12-30 LAB — COMPREHENSIVE METABOLIC PANEL
ALT: 84 U/L — ABNORMAL HIGH (ref 0–44)
AST: 52 U/L — ABNORMAL HIGH (ref 15–41)
Albumin: 3.2 g/dL — ABNORMAL LOW (ref 3.5–5.0)
Alkaline Phosphatase: 92 U/L (ref 38–126)
Anion gap: 9 (ref 5–15)
BUN: 15 mg/dL (ref 6–20)
CO2: 23 mmol/L (ref 22–32)
Calcium: 8.6 mg/dL — ABNORMAL LOW (ref 8.9–10.3)
Chloride: 106 mmol/L (ref 98–111)
Creatinine, Ser: 0.65 mg/dL (ref 0.44–1.00)
GFR calc Af Amer: >60 mL/min (ref >60)
GFR calc non Af Amer: >60 mL/min (ref >60)
Glucose, Bld: 122 mg/dL — ABNORMAL HIGH (ref 70–99)
Potassium: 4 mmol/L (ref 3.5–5.1)
Sodium: 138 mmol/L (ref 135–145)
Total Bilirubin: 0.6 mg/dL (ref 0.3–1.2)
Total Protein: 6.1 g/dL — ABNORMAL LOW (ref 6.5–8.1)

## 2019-12-30 LAB — CBC
HCT: 40.1 % (ref 36.0–46.0)
Hemoglobin: 12.8 g/dL (ref 12.0–15.0)
MCH: 30 pg (ref 26.0–34.0)
MCHC: 31.9 g/dL (ref 30.0–36.0)
MCV: 94.1 fL (ref 80.0–100.0)
Platelets: 268 10*3/uL (ref 150–400)
RBC: 4.26 MIL/uL (ref 3.87–5.11)
RDW: 13.2 % (ref 11.5–15.5)
WBC: 9.6 10*3/uL (ref 4.0–10.5)
nRBC: 0 % (ref 0.0–0.2)

## 2019-12-30 LAB — SARS CORONAVIRUS 2 (TAT 6-24 HRS): SARS Coronavirus 2: NEGATIVE

## 2019-12-30 MED ORDER — MAGNESIUM SULFATE 40 GM/1000ML IV SOLN
2.0000 g/h | INTRAVENOUS | Status: AC
  Administered 2019-12-31: 2 g/h via INTRAVENOUS
  Filled 2019-12-30 (×2): qty 1000

## 2019-12-30 MED ORDER — LABETALOL HCL 5 MG/ML IV SOLN
20.0000 mg | INTRAVENOUS | Status: DC | PRN
  Filled 2019-12-30: qty 4

## 2019-12-30 MED ORDER — MENTHOL 3 MG MT LOZG: 1.0000 | LOZENGE | OROMUCOSAL | Status: DC | PRN

## 2019-12-30 MED ORDER — DIPHENHYDRAMINE HCL 25 MG PO CAPS: 25.0000 mg | ORAL_CAPSULE | Freq: Four times a day (QID) | ORAL | Status: DC | PRN

## 2019-12-30 MED ORDER — COCONUT OIL OIL: 1.0000 "application " | TOPICAL_OIL | Status: DC | PRN

## 2019-12-30 MED ORDER — ENOXAPARIN SODIUM 40 MG/0.4ML ~~LOC~~ SOLN
40.0000 mg | SUBCUTANEOUS | Status: DC
  Administered 2019-12-31: 22:00:00 40 mg via SUBCUTANEOUS
  Filled 2019-12-30: qty 0.4

## 2019-12-30 MED ORDER — LABETALOL HCL 5 MG/ML IV SOLN: 80.0000 mg | INTRAVENOUS | Status: DC | PRN

## 2019-12-30 MED ORDER — LACTATED RINGERS IV SOLN: INTRAVENOUS | Status: AC

## 2019-12-30 MED ORDER — SENNOSIDES-DOCUSATE SODIUM 8.6-50 MG PO TABS
2.0000 | ORAL_TABLET | Freq: Every evening | ORAL | Status: DC | PRN
  Administered 2019-12-31: 2 via ORAL
  Filled 2019-12-30: qty 2

## 2019-12-30 MED ORDER — SIMETHICONE 80 MG PO CHEW: 80.0000 mg | CHEWABLE_TABLET | Freq: Four times a day (QID) | ORAL | Status: DC | PRN

## 2019-12-30 MED ORDER — HYDRALAZINE HCL 50 MG PO TABS: 50.0000 mg | ORAL_TABLET | Freq: Three times a day (TID) | ORAL | Status: DC | PRN

## 2019-12-30 MED ORDER — NIFEDIPINE ER OSMOTIC RELEASE 30 MG PO TB24
30.0000 mg | ORAL_TABLET | Freq: Every day | ORAL | Status: DC
  Administered 2019-12-30 – 2020-01-01 (×3): 30 mg via ORAL
  Filled 2019-12-30 (×4): qty 1

## 2019-12-30 MED ORDER — IBUPROFEN 600 MG PO TABS
600.0000 mg | ORAL_TABLET | Freq: Four times a day (QID) | ORAL | Status: DC | PRN
  Administered 2019-12-31 (×2): 600 mg via ORAL
  Filled 2019-12-30 (×2): qty 1

## 2019-12-30 MED ORDER — CYCLOBENZAPRINE HCL 5 MG PO TABS
10.0000 mg | ORAL_TABLET | Freq: Once | ORAL | Status: AC
  Administered 2019-12-30: 10 mg via ORAL
  Filled 2019-12-30: qty 2

## 2019-12-30 MED ORDER — DIBUCAINE (PERIANAL) 1 % EX OINT: 1.0000 "application " | TOPICAL_OINTMENT | CUTANEOUS | Status: DC | PRN

## 2019-12-30 MED ORDER — WITCH HAZEL-GLYCERIN EX PADS: 1.0000 "application " | MEDICATED_PAD | CUTANEOUS | Status: DC | PRN

## 2019-12-30 MED ORDER — PANTOPRAZOLE SODIUM 40 MG PO TBEC
40.0000 mg | DELAYED_RELEASE_TABLET | Freq: Every day | ORAL | Status: DC
  Administered 2019-12-30 – 2020-01-01 (×3): 40 mg via ORAL
  Filled 2019-12-30 (×3): qty 1

## 2019-12-30 MED ORDER — LABETALOL HCL 5 MG/ML IV SOLN: 40.0000 mg | INTRAVENOUS | Status: DC | PRN

## 2019-12-30 MED ORDER — PRENATAL MULTIVITAMIN CH
1.0000 | ORAL_TABLET | Freq: Every day | ORAL | Status: DC
  Administered 2020-01-01: 1 via ORAL
  Filled 2019-12-30: qty 1

## 2019-12-30 MED ORDER — ACETAMINOPHEN 500 MG PO TABS
1000.0000 mg | ORAL_TABLET | Freq: Once | ORAL | Status: AC
  Administered 2019-12-30: 1000 mg via ORAL
  Filled 2019-12-30: qty 2

## 2019-12-30 MED ORDER — MAGNESIUM SULFATE BOLUS VIA INFUSION
4.0000 g | Freq: Once | INTRAVENOUS | Status: AC
  Administered 2019-12-30: 4 g via INTRAVENOUS
  Filled 2019-12-30: qty 1000

## 2019-12-30 MED ORDER — OXYCODONE HCL 5 MG PO TABS: 5.0000 mg | ORAL_TABLET | ORAL | Status: DC | PRN

## 2019-12-30 NOTE — MAU Note (Signed)
Pt states that she had a c-section on 12/17/2019.   Pt states she was seen at her PP visit today and was told her blood pressure was elevated and that she needed to come here to be evaluated.

## 2019-12-30 NOTE — MAU Note (Signed)
Covid swab obtained without difficulty and pt tol well. No symptoms 

## 2019-12-30 NOTE — H&P (Addendum)
Chief Complaint  Patient presents with  . Hypertension       First Provider Initiated Contact with Patient 12/30/19 1630       S: Emily Webb  is a 34 y.o. y.o. year old G29P2012 female at 2 weeks post partum who presents to MAU with elevated blood pressures. Denies Hx of hypertension. Current blood pressure medication: n/a.  She was seen in the office ([facility]) today for BP check due to reporting headache. Has had a constant headache since Tuesday. Headache in back of head to neck. Nothing makes better or worse. Has not treated symptoms. No photophobia or phonophobia. Symptoms not affected by position changes. Denies visual disturbance or epigastric pain.    O:  Patient Vitals for the past 24 hrs:   BP Temp Temp src Pulse Resp SpO2 Weight  12/30/19 1745 (!) 144/96 -- -- (!) 59 -- 99 % --  12/30/19 1730 (!) 138/92 -- -- 64 -- -- --  12/30/19 1715 (!) 152/107 -- -- 76 -- -- --  12/30/19 1700 (!) 156/96 -- -- 69 -- -- --  12/30/19 1645 (!) 158/97 -- -- 70 -- -- --  12/30/19 1630 (!) 159/108 -- -- 79 -- -- --  12/30/19 1625 (!) 160/107 -- -- 77 -- -- --  12/30/19 1607 (!) 159/104 97.9 F (36.6 C) Oral 82 16 97 % --  12/30/19 1558 -- -- -- -- -- -- 62.3 kg    General: NAD Heart: Regular rate Lungs: Normal rate and effort Abd: Soft, NT, Gravid, S=D Extremities: No Pedal edema Neuro: 2+ deep tendon reflexes, No clonus       Lab Results Last 24 Hours       Results for orders placed or performed during the hospital encounter of 12/30/19 (from the past 24 hour(s))  CBC     Status: None    Collection Time: 12/30/19  4:49 PM  Result Value Ref Range    WBC 9.6 4.0 - 10.5 K/uL    RBC 4.26 3.87 - 5.11 MIL/uL    Hemoglobin 12.8 12.0 - 15.0 g/dL    HCT 40.1 36.0 - 46.0 %    MCV 94.1 80.0 - 100.0 fL    MCH 30.0 26.0 - 34.0 pg    MCHC 31.9 30.0 - 36.0 g/dL    RDW 13.2 11.5 - 15.5 %    Platelets 268 150 - 400 K/uL    nRBC 0.0 0.0 - 0.2 %  Comprehensive metabolic panel     Status:  Abnormal    Collection Time: 12/30/19  4:49 PM  Result Value Ref Range    Sodium 138 135 - 145 mmol/L    Potassium 4.0 3.5 - 5.1 mmol/L    Chloride 106 98 - 111 mmol/L    CO2 23 22 - 32 mmol/L    Glucose, Bld 122 (H) 70 - 99 mg/dL    BUN 15 6 - 20 mg/dL    Creatinine, Ser 0.65 0.44 - 1.00 mg/dL    Calcium 8.6 (L) 8.9 - 10.3 mg/dL    Total Protein 6.1 (L) 6.5 - 8.1 g/dL    Albumin 3.2 (L) 3.5 - 5.0 g/dL    AST 52 (H) 15 - 41 U/L    ALT 84 (H) 0 - 44 U/L    Alkaline Phosphatase 92 38 - 126 U/L    Total Bilirubin 0.6 0.3 - 1.2 mg/dL    GFR calc non Af Amer >60 >60 mL/min    GFR calc Af  Amer >60 >60 mL/min    Anion gap 9 5 - 15        MDM Severe range BP x 1. Rest of BPs elevated but not severe range.  Tylenol & flexeril given for headache with good response, bringing pain score down to 2/10.  Platelets normal but LFTs elevated. C/w Dr. Vergie Living & will admit for mag sulfate & BP control.    A:  Postpartum preeclampsia   P:  Admit to obsc unit Start procardia xl 30 mg daily Mag sulfate 4 gm bolus f/b 2 gm/hour Dr. Vergie Living to speak with patient regarding plan   Judeth Horn, NP 12/30/2019 5:39 PM*Admit to [facility]. Breast feeding *Severe pre-eclampsia (HA, BP, elevated LFTs): d/w pt that recommend 24hrs Mg, start procardia xl 30 qday and rpt labs in am. *PPx: lovenox *Dispo: hopefully 2/13

## 2019-12-30 NOTE — Progress Notes (Signed)
Pt here today with c/o headache for the past 4 days.  Per chart review pt was advised on 12/27/19 to go to MAU for evaluation.  BP 152/118 RA.  Pt reports having constant headache and blurred vision.  Informed Dr. Debroah Loop who recommends that pt go to MAU now.  Notified the pt and her husband that it is important that the pt goes to MAU for evaluation.  I explained that women can get pp preeclampsia which is why we want her to MAU.  I also explained that MAU would be able to take better care of her because they have all the resources to be able to manage her.  Pt's husband stated that they would get the baby milk taking care of and then be able to go to MAU within a couple of hours.  Report given to [facility] in MAU.    Addison Naegeli, RN 12/30/19

## 2019-12-30 NOTE — MAU Provider Note (Signed)
Chief Complaint  Patient presents with  . Hypertension     First Provider Initiated Contact with Patient 12/30/19 1630      S: Emily Webb  is a 34 y.o. y.o. year old G73P2012 female at 2 weeks post partum who presents to MAU with elevated blood pressures. Denies Hx of hypertension. Current blood pressure medication: n/a.  She was seen in the office ([facility]) today for BP check due to reporting headache. Has had a constant headache since Tuesday. Headache in back of head to neck. Nothing makes better or worse. Has not treated symptoms. No photophobia or phonophobia. Symptoms not affected by position changes. Denies visual disturbance or epigastric pain.   O:  Patient Vitals for the past 24 hrs:  BP Temp Temp src Pulse Resp SpO2 Weight  12/30/19 1745 (!) 144/96 -- -- (!) 59 -- 99 % --  12/30/19 1730 (!) 138/92 -- -- 64 -- -- --  12/30/19 1715 (!) 152/107 -- -- 76 -- -- --  12/30/19 1700 (!) 156/96 -- -- 69 -- -- --  12/30/19 1645 (!) 158/97 -- -- 70 -- -- --  12/30/19 1630 (!) 159/108 -- -- 79 -- -- --  12/30/19 1625 (!) 160/107 -- -- 77 -- -- --  12/30/19 1607 (!) 159/104 97.9 F (36.6 C) Oral 82 16 97 % --  12/30/19 1558 -- -- -- -- -- -- 62.3 kg   General: NAD Heart: Regular rate Lungs: Normal rate and effort Abd: Soft, NT, Gravid, S=D Extremities: No Pedal edema Neuro: 2+ deep tendon reflexes, No clonus    Results for orders placed or performed during the hospital encounter of 12/30/19 (from the past 24 hour(s))  CBC     Status: None   Collection Time: 12/30/19  4:49 PM  Result Value Ref Range   WBC 9.6 4.0 - 10.5 K/uL   RBC 4.26 3.87 - 5.11 MIL/uL   Hemoglobin 12.8 12.0 - 15.0 g/dL   HCT 26.9 48.5 - 46.2 %   MCV 94.1 80.0 - 100.0 fL   MCH 30.0 26.0 - 34.0 pg   MCHC 31.9 30.0 - 36.0 g/dL   RDW 70.3 50.0 - 93.8 %   Platelets 268 150 - 400 K/uL   nRBC 0.0 0.0 - 0.2 %  Comprehensive metabolic panel     Status: Abnormal   Collection Time: 12/30/19  4:49 PM  Result  Value Ref Range   Sodium 138 135 - 145 mmol/L   Potassium 4.0 3.5 - 5.1 mmol/L   Chloride 106 98 - 111 mmol/L   CO2 23 22 - 32 mmol/L   Glucose, Bld 122 (H) 70 - 99 mg/dL   BUN 15 6 - 20 mg/dL   Creatinine, Ser 1.82 0.44 - 1.00 mg/dL   Calcium 8.6 (L) 8.9 - 10.3 mg/dL   Total Protein 6.1 (L) 6.5 - 8.1 g/dL   Albumin 3.2 (L) 3.5 - 5.0 g/dL   AST 52 (H) 15 - 41 U/L   ALT 84 (H) 0 - 44 U/L   Alkaline Phosphatase 92 38 - 126 U/L   Total Bilirubin 0.6 0.3 - 1.2 mg/dL   GFR calc non Af Amer >60 >60 mL/min   GFR calc Af Amer >60 >60 mL/min   Anion gap 9 5 - 15    MDM Severe range BP x 1. Rest of BPs elevated but not severe range.  Tylenol & flexeril given for headache with good response, bringing pain score down to 2/10.  Platelets normal  but LFTs elevated. C/w Dr. Ilda Basset & will admit for mag sulfate & BP control.   A:  Postpartum preeclampsia  P:  Admit to obsc unit Start procardia xl 30 mg daily Mag sulfate 4 gm bolus f/b 2 gm/hour Dr. Ilda Basset to speak with patient regarding plan  Jorje Guild, NP 12/30/2019 5:39 PM

## 2019-12-31 LAB — COMPREHENSIVE METABOLIC PANEL
ALT: 86 U/L — ABNORMAL HIGH (ref 0–44)
AST: 44 U/L — ABNORMAL HIGH (ref 15–41)
Albumin: 3.3 g/dL — ABNORMAL LOW (ref 3.5–5.0)
Alkaline Phosphatase: 108 U/L (ref 38–126)
Anion gap: 11 (ref 5–15)
BUN: 12 mg/dL (ref 6–20)
CO2: 26 mmol/L (ref 22–32)
Calcium: 7.6 mg/dL — ABNORMAL LOW (ref 8.9–10.3)
Chloride: 99 mmol/L (ref 98–111)
Creatinine, Ser: 0.61 mg/dL (ref 0.44–1.00)
GFR calc Af Amer: >60 mL/min (ref >60)
GFR calc non Af Amer: >60 mL/min (ref >60)
Glucose, Bld: 115 mg/dL — ABNORMAL HIGH (ref 70–99)
Potassium: 4.2 mmol/L (ref 3.5–5.1)
Sodium: 136 mmol/L (ref 135–145)
Total Bilirubin: 0.9 mg/dL (ref 0.3–1.2)
Total Protein: 6.4 g/dL — ABNORMAL LOW (ref 6.5–8.1)

## 2019-12-31 LAB — CBC
HCT: 42.9 % (ref 36.0–46.0)
Hemoglobin: 13.9 g/dL (ref 12.0–15.0)
MCH: 30 pg (ref 26.0–34.0)
MCHC: 32.4 g/dL (ref 30.0–36.0)
MCV: 92.5 fL (ref 80.0–100.0)
Platelets: 274 10*3/uL (ref 150–400)
RBC: 4.64 MIL/uL (ref 3.87–5.11)
RDW: 13.2 % (ref 11.5–15.5)
WBC: 10.9 10*3/uL — ABNORMAL HIGH (ref 4.0–10.5)
nRBC: 0 % (ref 0.0–0.2)

## 2019-12-31 NOTE — Lactation Note (Addendum)
Lactation Consultation Note  Patient Name: Emily Webb Date: 12/31/2019 Reason for consult: Follow-up assessment  Mother requested latch observation.  Parents called for a follow up like we discussed.  Unfortunately, when I arrived baby had just finished breast feeding.  Mother informed me that she could hear him swallowing.  He still appeared hungry and consumed an additional 15 mls of formula.  Demonstrated burping and swaddling and placed him in the bassinet where he lay quiet and awake.    Assisted mother to initiate pumping again and she will continue in this manner throughout the day/evening.  Baby will breast feed followed by consuming any volume mother obtains with pumping and, if still hungry, supplement with formula.  Volume guidelines suggested for a baby at 2 weeks of life.  Father had more questions which I answered to his satisfaction.  Suggested parents eat lunch and rest after mother pumps.  They will call as needed.  RN updated.   Maternal Data    Feeding    LATCH Score                   Interventions    Lactation Tools Discussed/Used     Consult Status Consult Status: PRN    Shaia Porath R Champion Corales 12/31/2019, 1:21 PM

## 2019-12-31 NOTE — Lactation Note (Addendum)
Lactation Consultation Note  Patient Name: Emily Webb [id number] Date: 12/31/2019 Reason for consult: Follow-up assessment  P2 mother whose infant is now 67 weeks old.  This is a term baby.  Mother breast fed her first child (now 34 years old) for 2 1/2 years with no difficulty.  Baby appears jaundiced.  Mother came to the hospital on 12/30/19 with elevated blood pressure and headaches.  Mother stated she has had this constant headache since Tuesday.  Father informed me that they thought it was due to stress from delivery.  Per MD note, mother admitted for PP Pre eclampsia.  When I arrived to the room baby was asleep in the bassinet.  Encouraged father to update me on the process leading up to admission.  Father informed me that they took their son to the pediatrician's office for a visit and he has not started gaining weight yet.  The pediatrician is very concerned and told the family that they need to bring their son back on Monday for a follow up appointment.  If he does not show improvement with his weight she will obtain some lab tests.  After the pediatrician's visit they came to MAU for mother's continued headache and were admitted.  Father stated that baby was eating for "an hour at the breast and would keep crying after he breast fed."  Father thought he may have been crying "for no reason."  I explained that, after feeding adequately, baby should have been content.  Educated the parents that if he continued to cry after feedings that he was not content and happy.  It is my assumption that their son has probably not been feeding well at the breast and that he continued to cry due to hunger.    With mother's continued medical issue and baby apparently hungry, but not feeding effectively,  he continued to lose weight.  Mother stated she pumped with her DEBP and got only a very small amount of EBM from pumping (probably only a couple of mls).  Discussed the situation with the parents and offered to  initiate the DEBP.  Mother willing to pump. Pump parts, assembly, disassembly and cleaning reviewed with both parents.  Observed mother pumping for 15 minutes while continuing to educate regarding pumping, feeding and supplementing.  Mother was able to obtain a total of 20 mls of EBM during this pumping session.    With parents input, I developed a workable feeding plan.  Parents in agreement.  Baby will be breast fed prior to any supplementation beginning with the next feeding.  Mother will follow breast feeding with supplementation of any EBM she has obtained and then give formula if he continues to be hungry or show feeding cues.  The goal is to feed baby as much as desired to increase the likelihood of weight gain by Monday when he returns for his pediatric follow up visit.  I asked father to call me for the next feeding so I can observe baby's breast feeding ability.  Parents very appreciative and are eager for me to return.  Father asked many questions and I took time to provide emotional support and reassurance that we will do our best to help them with their feeding plan.  RN updated.   Maternal Data    Feeding    LATCH Score                   Interventions    Lactation Tools Discussed/Used     Consult  Status Consult Status: PRN    Amarrion Pastorino R Akyra Bouchie 12/31/2019, 11:40 AM

## 2019-12-31 NOTE — Progress Notes (Signed)
Patient ID: Emily Webb, female   DOB: [birth year], 34 y.o.   MRN: [id number] Patient seen and assessed by nursing staff during this encounter. I have reviewed the chart and agree with the documentation and plan.  Scheryl Darter, MD 12/31/2019 5:38 PM

## 2019-12-31 NOTE — Progress Notes (Signed)
Daily Antepartum Note  Admission Date: 12/30/2019 Current Date: 12/31/2019 7:44 AM  Kamarah Sellin is a 34 y.o. [id number], HD#2/PO 1/29 scheduled rpt c/s, admitted for severe pre-eclampsia (HA, BPs, LFTs).  Pregnancy complicated by: Patient Active Problem List   Diagnosis Date Noted  . Severe pre-eclampsia 12/30/2019  . Elevated transaminase level 12/30/2019  . S/P cesarean section 12/17/2019  . Previous cesarean section complicating pregnancy 77/41/2878  . Supervision of high risk pregnancy, antepartum 11/24/2019  . Gestational diabetes mellitus (GDM) 11/24/2019    Overnight/24hr events:  none  Subjective:  Pt feeling much better today.   Objective:    Current Vital Signs 24h Vital Sign Ranges  T 97.7 F (36.5 C) Temp  Avg: 97.9 F (36.6 C)  Min: 97.6 F (36.4 C)  Max: 98.3 F (36.8 C)  BP 111/71 BP  Min: 100/72  Max: 160/98  HR 72 Pulse  Avg: 71.7  Min: 59  Max: 82  RR 18 Resp  Avg: 16.7  Min: 12  Max: 20  SaO2 100 % Room Air SpO2  Avg: 97.8 %  Min: 95 %  Max: 100 %       24 Hour I/O Current Shift I/O  Time Ins Outs 02/11 0701 - 02/12 0700 In: 2892.9 [P.O.:1440; I.V.:1452.9] Out: 2800 [Urine:2800] No intake/output data recorded.   Patient Vitals for the past 24 hrs:  BP Temp Temp src Pulse Resp SpO2 Height Weight  12/31/19 0737 111/71 97.7 F (36.5 C) Oral 72 18 100 % -- --  12/31/19 0601 103/67 97.6 F (36.4 C) Oral 71 18 99 % -- --  12/31/19 0500 -- -- -- -- 18 -- -- --  12/31/19 0400 -- -- -- -- 18 -- -- --  12/31/19 0300 -- -- -- -- 17 -- -- --  12/31/19 0206 123/86 -- -- 75 17 -- -- --  12/31/19 0100 100/72 -- -- 67 16 -- -- --  12/31/19 0045 -- -- -- -- -- 99 % -- --  12/31/19 0040 -- -- -- -- -- 100 % -- --  12/31/19 0035 -- -- -- -- -- 97 % -- --  12/31/19 0030 -- -- -- -- -- 98 % -- --  12/31/19 0025 -- -- -- -- -- 97 % -- --  12/31/19 0020 -- -- -- -- -- 97 % -- --  12/31/19 0015 -- -- -- -- -- 99 % -- --  12/31/19 0010 -- -- -- -- -- 99 % -- --   12/31/19 0005 -- -- -- -- -- 95 % -- --  12/31/19 0000 105/70 97.7 F (36.5 C) Oral 69 14 95 % -- --  12/30/19 2355 -- -- -- -- -- 95 % -- --  12/30/19 2350 -- -- -- -- -- 95 % -- --  12/30/19 2300 108/77 -- -- 74 16 -- -- --  12/30/19 2200 121/82 -- -- 71 16 -- -- --  12/30/19 2100 128/83 -- -- 76 16 -- -- --  12/30/19 2012 -- -- -- -- -- -- 5\' 1"  (1.549 m) --  12/30/19 2010 140/82 -- -- 69 16 -- -- --  12/30/19 1903 (!) 144/94 -- -- 68 19 -- -- --  12/30/19 1840 (!) 151/106 98.2 F (36.8 C) -- 80 20 99 % -- --  12/30/19 1828 (!) 159/105 98.3 F (36.8 C) -- 70 12 98 % -- --  12/30/19 1808 (!) 160/98 98.2 F (36.8 C) Oral 68 -- 100 % -- --  12/30/19 1745 Marland Kitchen)  144/96 -- -- (!) 59 -- 99 % -- --  12/30/19 1730 (!) 138/92 -- -- 64 -- -- -- --  12/30/19 1715 (!) 152/107 -- -- 76 -- -- -- --  12/30/19 1700 (!) 156/96 -- -- 69 -- -- -- --  12/30/19 1645 (!) 158/97 -- -- 70 -- -- -- --  12/30/19 1630 (!) 159/108 -- -- 79 -- -- -- --  12/30/19 1625 (!) 160/107 -- -- 77 -- -- -- --  12/30/19 1607 (!) 159/104 97.9 F (36.6 C) Oral 82 16 97 % -- --  12/30/19 1558 -- -- -- -- -- -- -- 62.3 kg   UOP: >131mL/hr  Physical exam: General: Well nourished, well developed female in no acute distress. Abdomen: nttp Cardiovascular: S1, S2 normal, no murmur, rub or gallop, regular rate and rhythm Respiratory: CTAB Extremities: no clubbing, cyanosis or edema Skin: Warm and dry.   Medications: Current Facility-Administered Medications  Medication Dose Route Frequency Provider Last Rate Last Admin  . coconut oil  1 application Topical PRN [place] Bing, MD      . witch hazel-glycerin (TUCKS) pad 1 application  1 application Topical PRN [place] Bing, MD       And  . dibucaine (NUPERCAINAL) 1 % rectal ointment 1 application  1 application Rectal PRN [place] Bing, MD      . diphenhydrAMINE (BENADRYL) capsule 25 mg  25 mg Oral Q6H PRN [place] Bing, MD      . enoxaparin (LOVENOX)  injection 40 mg  40 mg Subcutaneous Q24H [place] Bing, MD      . labetalol (NORMODYNE) injection 20 mg  20 mg Intravenous PRN [place] Bing, MD       And  . labetalol (NORMODYNE) injection 40 mg  40 mg Intravenous PRN [place] Bing, MD       And  . labetalol (NORMODYNE) injection 80 mg  80 mg Intravenous PRN [place] Bing, MD       And  . hydrALAZINE (APRESOLINE) tablet 50 mg  50 mg Oral Q8H PRN [place] Bing, MD      . ibuprofen (ADVIL) tablet 600 mg  600 mg Oral Q6H PRN [place] Bing, MD   600 mg at 12/31/19 0049  . lactated ringers infusion   Intravenous Continuous [place] Bing, MD 75 mL/hr at 12/31/19 0610 New Bag at 12/31/19 0610  . magnesium sulfate 40 grams in SWI 1000 mL OB infusion  2 g/hr Intravenous Titrated [place] Bing, MD 50 mL/hr at 12/30/19 2000 2 g/hr at 12/30/19 2000  . menthol-cetylpyridinium (CEPACOL) lozenge 3 mg  1 lozenge Oral Q2H PRN [place] Bing, MD      . NIFEdipine (PROCARDIA-XL/NIFEDICAL-XL) 24 hr tablet 30 mg  30 mg Oral Daily [place] Bing, MD   30 mg at 12/30/19 1810  . oxyCODONE (Oxy IR/ROXICODONE) immediate release tablet 5-10 mg  5-10 mg Oral Q4H PRN [place] Bing, MD      . pantoprazole (PROTONIX) EC tablet 40 mg  40 mg Oral Daily [place] Bing, MD   40 mg at 12/30/19 1956  . prenatal multivitamin tablet 1 tablet  1 tablet Oral Q1200 [place] Bing, MD      . senna-docusate (Senokot-S) tablet 2 tablet  2 tablet Oral QHS PRN [place] Bing, MD      . simethicone (MYLICON) chewable tablet 80 mg  80 mg Oral QID PRN [place] Bing, MD        Labs:  Recent Labs  Lab 12/30/19 1649 12/31/19 0511  WBC 9.6 10.9*  HGB 12.8 13.9  HCT 40.1 42.9  PLT 268 274    Recent Labs  Lab 12/30/19 1649 12/31/19 0511  NA 138 136  K 4.0 4.2  CL 106 99  CO2 23 26  BUN 15 12  CREATININE 0.65 0.61  CALCIUM 8.6* 7.6*  PROT 6.1* 6.4*  BILITOT 0.6 0.9  ALKPHOS 92 108  ALT 84* 86*  AST 52* 44*  GLUCOSE 122* 115*      Radiology: no new imaging  Assessment & Plan:  Pt improving *PP: routine care. breastfeeding *Severe pre-x: continue procardia xl. Continue mg until 1800 today *PPx: lovenox *FEN/GI: reg diet, mivf *Dispo: likely tomorrow morning.   Cornelia Copa MD Attending Center for [facility])

## 2020-01-01 MED ORDER — NIFEDIPINE ER 30 MG PO TB24: 30.0000 mg | ORAL_TABLET | Freq: Every day | ORAL | 3 refills | Status: DC

## 2020-01-01 NOTE — Discharge Instructions (Signed)
Preeclampsia and Eclampsia Preeclampsia is a serious condition that may develop during pregnancy. This condition causes high blood pressure and increased protein in your urine along with other symptoms, such as headaches and vision changes. These symptoms may develop as the condition gets worse. Preeclampsia may occur at 20 weeks of pregnancy or later. Diagnosing and treating preeclampsia early is very important. If not treated early, it can cause serious problems for you and your baby. One problem it can lead to is eclampsia. Eclampsia is a condition that causes muscle jerking or shaking (convulsions or seizures) and other serious problems for the mother. During pregnancy, delivering your baby may be the best treatment for preeclampsia or eclampsia. For most women, preeclampsia and eclampsia symptoms go away after giving birth. In rare cases, a woman may develop preeclampsia after giving birth (postpartum preeclampsia). This usually occurs within 48 hours after childbirth but may occur up to 6 weeks after giving birth. What are the causes? The cause of preeclampsia is not known. What increases the risk? The following risk factors make you more likely to develop preeclampsia:  Being pregnant for the first time.  Having had preeclampsia during a past pregnancy.  Having a family history of preeclampsia.  Having high blood pressure.  Being pregnant with more than one baby.  Being 35 or older.  Being African-American.  Having kidney disease or diabetes.  Having medical conditions such as lupus or blood diseases.  Being very overweight (obese). What are the signs or symptoms? The most common symptoms are:  Severe headaches.  Vision problems, such as blurred or double vision.  Abdominal pain, especially upper abdominal pain. Other symptoms that may develop as the condition gets worse include:  Sudden weight gain.  Sudden swelling of the hands, face, legs, and feet.  Severe nausea  and vomiting.  Numbness in the face, arms, legs, and feet.  Dizziness.  Urinating less than usual.  Slurred speech.  Convulsions or seizures. How is this diagnosed? There are no screening tests for preeclampsia. Your health care provider will ask you about symptoms and check for signs of preeclampsia during your prenatal visits. You may also have tests that include:  Checking your blood pressure.  Urine tests to check for protein. Your health care provider will check for this at every prenatal visit.  Blood tests.  Monitoring your baby's heart rate.  Ultrasound. How is this treated? You and your health care provider will determine the treatment approach that is best for you. Treatment may include:  Having more frequent prenatal exams to check for signs of preeclampsia, if you have an increased risk for preeclampsia.  Medicine to lower your blood pressure.  Staying in the hospital, if your condition is severe. There, treatment will focus on controlling your blood pressure and the amount of fluids in your body (fluid retention).  Taking medicine (magnesium sulfate) to prevent seizures. This may be given as an injection or through an IV.  Taking a low-dose aspirin during your pregnancy.  Delivering your baby early. You may have your labor started with medicine (induced), or you may have a cesarean delivery. Follow these instructions at home: Eating and drinking   Drink enough fluid to keep your urine pale yellow.  Avoid caffeine. Lifestyle  Do not use any products that contain nicotine or tobacco, such as cigarettes and e-cigarettes. If you need help quitting, ask your health care provider.  Do not use alcohol or drugs.  Avoid stress as much as possible. Rest and get   plenty of sleep. General instructions  Take over-the-counter and prescription medicines only as told by your health care provider.  When lying down, lie on your left side. This keeps pressure off your  major blood vessels.  When sitting or lying down, raise (elevate) your feet. Try putting some pillows underneath your lower legs.  Exercise regularly. Ask your health care provider what kinds of exercise are best for you.  Keep all follow-up and prenatal visits as told by your health care provider. This is important. How is this prevented? There is no known way of preventing preeclampsia or eclampsia from developing. However, to lower your risk of complications and detect problems early:  Get regular prenatal care. Your health care provider may be able to diagnose and treat the condition early.  Maintain a healthy weight. Ask your health care provider for help managing weight gain during pregnancy.  Work with your health care provider to manage any long-term (chronic) health conditions you have, such as diabetes or kidney problems.  You may have tests of your blood pressure and kidney function after giving birth.  Your health care provider may have you take low-dose aspirin during your next pregnancy. Contact a health care provider if:  You have symptoms that your health care provider told you may require more treatment or monitoring, such as: ? Headaches. ? Nausea or vomiting. ? Abdominal pain. ? Dizziness. ? Light-headedness. Get help right away if:  You have severe: ? Abdominal pain. ? Headaches that do not get better. ? Dizziness. ? Vision problems. ? Confusion. ? Nausea or vomiting.  You have any of the following: ? A seizure. ? Sudden, rapid weight gain. ? Sudden swelling in your hands, ankles, or face. ? Trouble moving any part of your body. ? Numbness in any part of your body. ? Trouble speaking. ? Abnormal bleeding.  You faint. Summary  Preeclampsia is a serious condition that may develop during pregnancy.  This condition causes high blood pressure and increased protein in your urine along with other symptoms, such as headaches and vision  changes.  Diagnosing and treating preeclampsia early is very important. If not treated early, it can cause serious problems for you and your baby.  Get help right away if you have symptoms that your health care provider told you to watch for. This information is not intended to replace advice given to you by your health care provider. Make sure you discuss any questions you have with your health care provider. Document Revised: 07/07/2018 Document Reviewed: 06/10/2016 Elsevier Patient Education  2020 Elsevier Inc.  

## 2020-01-01 NOTE — Discharge Summary (Signed)
Physician Discharge Summary  Patient ID: Emily Webb MRN: [id number] DOB/AGE: 06/25/1986 34 y.o.  Admit date: 12/30/2019 Discharge date: 01/01/2020  Admission Diagnoses: postpartum pre eclampsia  Discharge Diagnoses: same Active Problems:   Pre-eclampsia, severe, postpartum condition   Elevated transaminase level   Discharged Condition: good  Hospital Course: She was admitted with signs/symptoms of severe pre eclampsia  (s/p RLTCS on 12/17/19). She was given magnesium for 24 hours and started on procardia XL 30 mg daily. This controlled her BPs nicely. Her headache resolved. Her AST and ALT were elevated at 52 and 84 respectively. Her platelets were stable at 268k on 12/30/19.  Consults: None  Significant Diagnostic Studies: labs: as above  Treatments: magnesium and BP management  Discharge Exam: Blood pressure 128/88, pulse 86, temperature 97.7 F (36.5 C), temperature source Oral, resp. rate 18, height 5\' 1"  (1.549 m), weight 62.3 kg, last menstrual period 03/19/2019, SpO2 100 %, currently breastfeeding. General appearance: alert Resp: clear to auscultation bilaterally Cardio: regular rate and rhythm, S1, S2 normal, no murmur, click, rub or gallop Extremities: extremities normal, atraumatic, no cyanosis or edema  No edema at all DTR 1+  Disposition: Discharge disposition: 01-Home or Self Care        Allergies as of 01/01/2020   No Known Allergies     Medication List    STOP taking these medications   senna-docusate 8.6-50 MG tablet Commonly known as: Senokot-S     TAKE these medications   ibuprofen 800 MG tablet Commonly known as: ADVIL Take 1 tablet (800 mg total) by mouth every 8 (eight) hours.   NIFEdipine 30 MG 24 hr tablet Commonly known as: ADALAT CC Take 1 tablet (30 mg total) by mouth daily.   omeprazole 20 MG capsule Commonly known as: PRILOSEC Take 20 mg by mouth daily.   oxyCODONE-acetaminophen 5-325 MG tablet Commonly known as:  PERCOCET/ROXICET Take 1-2 tablets by mouth every 4 (four) hours as needed for moderate pain.   prenatal multivitamin Tabs tablet Take 1 tablet by mouth daily at 12 noon.      Follow-up Information    [facility] for [facility] Follow up.   Specialty: Obstetrics and Gynecology Why: She has a nurse BP visit on Monday (in 2 days) Will also need a 4 week postpartum visit Contact information: [address], [address] [place] [phone]          Signed: [phone] 01/01/2020, 5:54 AM

## 2020-01-01 NOTE — Lactation Note (Signed)
Lactation Consultation Note  Patient Name: Emily Webb Date: 01/01/2020 Reason for consult: Follow-up assessment;Difficult latch shallow latch and baby possibly not transferring adequate milk from breast.  Visited with P2 Mom of term baby at 33 weeks old.  Mom was readmitted for PP HTN, MgSO4.  Today is day of discharge.  Mom has been pumping and feeding baby 30 ml EBM by bottle after breastfeeding.    Baby noted to be cueing in his crib.  Changed a yellow seedy stool and wet and recommended Mom put baby to the breast.    Mom used cradle hold and pinched nipple with her fingers and latched baby onto nipple only.  Asked Mom if this is how baby usally latches and she said yes.  Showed Mom how to take baby off by breaking the suction first.  Mom has large diameter and long nipples.  Hand expression causes milk to spray across bed.  Breasts are soft but full.    Changed Mom's position to cross cradle and added pillow support under baby.  FOB helping Mom understand.  Baby latched to base of nipple, showed FOB how to gently pull down on chin to open baby's mouth wider, and flange lower lip.  Baby attained a deep areolar grasp and sucked/swallowed 1:1 showing deep jaw extensions.   Explained the importance of controlling the latch using cross cradle rather than cradle at this point.    Encouraged STS and feeding baby often with cues.  Recommended pumping 4-6 times for 20 mins, after breastfeeding and offering any EBM by slow flow paced bottle after breastfeeding.  Mom has a Medela PIS at home.  Mom knows to take pump parts home with her.    OP lactation appointment requested.  Pediatrician appointment Monday at 10:30 am.  Mom and FOB know they can call prn for concerns.   Importance of breastfeeding with a deep latch to breast with any cue, goal of 8-12 feedings per 24 hrs.   Feeding Feeding Type: Breast Fed  LATCH Score Latch: Grasps breast easily, tongue down, lips flanged, rhythmical  sucking.  Audible Swallowing: Spontaneous and intermittent  Type of Nipple: Everted at rest and after stimulation  Comfort (Breast/Nipple): Soft / non-tender  Hold (Positioning): Assistance needed to correctly position infant at breast and maintain latch.  LATCH Score: 9  Interventions Interventions: Breast feeding basics reviewed;Assisted with latch;Skin to skin;Breast massage;Hand express;Breast compression;Adjust position;Support pillows;Position options;DEBP  Lactation Tools Discussed/Used Tools: Pump Breast pump type: Double-Electric Breast Pump   Consult Status Consult Status: Complete Date: 01/01/20 Follow-up type: Out-patient  Request sent to clinic    Judee Clara 01/01/2020, 12:02 PM

## 2020-01-03 ENCOUNTER — Ambulatory Visit: Payer: 59

## 2020-01-03 ENCOUNTER — Encounter: Payer: Self-pay | Admitting: General Practice

## 2020-01-04 ENCOUNTER — Ambulatory Visit (INDEPENDENT_AMBULATORY_CARE_PROVIDER_SITE_OTHER): Payer: 59

## 2020-01-04 VITALS — BP 120/79 | HR 103 | Wt 134.9 lb

## 2020-01-04 DIAGNOSIS — Z5189 Encounter for other specified aftercare: Secondary | ICD-10-CM

## 2020-01-04 NOTE — Progress Notes (Signed)
Pt here for incision check s/p c-section on 12/17/19. Incision is open to air; incision is clean, dry, and intact with well approximated edges. Pt denies any pain associated with incision site. Reports mild neck pain. Reviewed normal vaginal bleeding after delivery.   Pt reports baby is not gaining weight appropriately per pediatrician. Pt reports decreased milk production. Currently supplementing with formula per pediatrician. Offered Lactation Consultant services; pt declines. States she was seen by [facility] during recent hospital admission and feels that she does not need further assistance at this time.   Pt would like to know when she will discontinue BP medicine. Explained to pt she will need to continue medication until her PP appt at which time her provider can decide if she needs further f/u for BP.   Fleet Contras RN 01/04/20

## 2020-01-05 NOTE — Progress Notes (Signed)
Chart reviewed for nurse visit. Agree with plan of care.   Sharyon Cable, CNM 01/05/2020 8:25 AM

## 2020-01-17 ENCOUNTER — Encounter: Payer: Self-pay | Admitting: *Deleted

## 2020-01-17 ENCOUNTER — Other Ambulatory Visit: Payer: Self-pay

## 2020-01-17 ENCOUNTER — Ambulatory Visit (INDEPENDENT_AMBULATORY_CARE_PROVIDER_SITE_OTHER): Payer: 59 | Admitting: Family Medicine

## 2020-01-17 VITALS — BP 134/99 | HR 85 | Temp 98.4°F

## 2020-01-17 DIAGNOSIS — L089 Local infection of the skin and subcutaneous tissue, unspecified: Secondary | ICD-10-CM

## 2020-01-17 DIAGNOSIS — T148XXA Other injury of unspecified body region, initial encounter: Secondary | ICD-10-CM

## 2020-01-17 MED ORDER — CEPHALEXIN 500 MG PO CAPS: 500.0000 mg | ORAL_CAPSULE | Freq: Three times a day (TID) | ORAL | 1 refills | Status: AC

## 2020-01-17 NOTE — Progress Notes (Signed)
Here today for wound check. States noticed on Saturday felt a little pain and husband checked and said there is a little redness. Called nurse line and they apply neosporin BID. She wants to get it checked.  Wound clean, dry, intact with slight pink/ redness near left edge of incision .Also Bp elevated- repeated. Patient denies headaches , edema or visual changes and is taking her procardia.  Asked Dr. Adrian Blackwater in to check wound and discussed bp's today and he reviewed her chart. Emily Webb

## 2020-01-17 NOTE — Progress Notes (Signed)
Instructed patient to keep wound clean  And dry. Take antibiotic as prescribed. Keep postpartum appointment as scheduled.  Legrand Como

## 2020-01-17 NOTE — Progress Notes (Signed)
Requested to see patient by nurse for concerns of wound infection. Slight erythema to 3-4cm area of left side of wound. Tender and slightly warm. Will start Keflex 500mg  TID x 5 days.  , DO

## 2020-01-21 ENCOUNTER — Other Ambulatory Visit: Payer: Self-pay

## 2020-01-25 ENCOUNTER — Ambulatory Visit: Payer: 59 | Admitting: Nurse Practitioner

## 2020-01-25 ENCOUNTER — Other Ambulatory Visit: Payer: 59

## 2020-01-26 ENCOUNTER — Other Ambulatory Visit: Payer: Self-pay

## 2020-01-27 ENCOUNTER — Other Ambulatory Visit: Payer: 59

## 2020-01-27 ENCOUNTER — Other Ambulatory Visit: Payer: Self-pay

## 2020-01-27 ENCOUNTER — Encounter: Payer: Self-pay | Admitting: Obstetrics and Gynecology

## 2020-01-27 ENCOUNTER — Ambulatory Visit (INDEPENDENT_AMBULATORY_CARE_PROVIDER_SITE_OTHER): Payer: 59 | Admitting: Obstetrics and Gynecology

## 2020-01-27 VITALS — BP 130/86 | HR 71 | Wt 129.2 lb

## 2020-01-27 DIAGNOSIS — Z1332 Encounter for screening for maternal depression: Secondary | ICD-10-CM | POA: Diagnosis not present

## 2020-01-27 DIAGNOSIS — R7401 Elevation of levels of liver transaminase levels: Secondary | ICD-10-CM

## 2020-01-27 NOTE — Progress Notes (Signed)
error 

## 2020-01-27 NOTE — Progress Notes (Signed)
Obstetrics and Gynecology Postpartum Visit  Appointment Date: 01/27/2020  OBGYN Clinic: [facility]  Primary Care Provider: Carlisle Cater  Chief Complaint:  Chief Complaint  Patient presents with  . Postpartum Care    History of Present Illness: Vaudie Engebretsen is a 34 y.o.  [id number] (LMP: none), seen for the above chief complaint. Her past medical history is significant for postpartum severe pre-eclampsia, GDMa1  She is s/p rpt pLTCS on 1/29; she was discharged to home on POD#2.  She was readmited on 2/11 for PP severe pre-eclampsia (HA, LFTs, BP) and discharged on HD#3 on procardia xl 30 qday  Vaginal bleeding or discharge: +spotting Breast or formula feeding: breast feeding q1-2h Intercourse: No  Contraception after delivery: abstinence PP depression s/s: No  Any bowel or bladder issues: No  Pap smear: no abnormalities (date: 2020) EPDS score zero  Review of Systems: as noted in the History of Present Illness.  Patient Active Problem List   Diagnosis Date Noted  . Pre-eclampsia, severe, postpartum condition 12/30/2019  . Elevated transaminase level 12/30/2019  . Previous cesarean section complicating pregnancy 12/02/2019    Medications Nico Skoda had no medications administered during this visit. Current Outpatient Medications  Medication Sig Dispense Refill  . acetaminophen (TYLENOL) 500 MG tablet Take 500 mg by mouth every 6 (six) hours as needed.    . Prenatal Vit-Fe Fumarate-FA (PRENATAL MULTIVITAMIN) TABS tablet Take 1 tablet by mouth daily at 12 noon.    Marland Kitchen ibuprofen (ADVIL) 800 MG tablet Take 1 tablet (800 mg total) by mouth every 8 (eight) hours. (Patient not taking: Reported on 01/27/2020) 30 tablet 0  . omeprazole (PRILOSEC) 20 MG capsule Take 20 mg by mouth daily.     No current facility-administered medications for this visit.    Allergies Patient has no known allergies.  Physical Exam:  BP 130/86   Pulse 71   Wt 129 lb 3.2  oz (58.6 kg)   BMI 24.41 kg/m  Body mass index is 24.41 kg/m. General appearance: Well nourished, well developed female in no acute distress.  Respiratory: Normal respiratory effort Abdomen: positive bowel sounds and no masses, hernias; diffusely non tender to palpation, non distended. Well healed low transverse skin incision Neuro/Psych:  Normal mood and affect.  Skin:  Warm and dry.   Laboratory: none  Assessment: pt doing well  Plan:  *PP: BC options d/w pt. Pt to consider options *GDM: 2h PP today *Elevated LFTs: rpt cmp. D/c procardia today. Pt to do bid BP checks  Orders Placed This Encounter  Procedures  . Comprehensive metabolic panel    RTC 1wk bp check   Cornelia Copa MD Attending Center for [facility])

## 2020-01-28 ENCOUNTER — Encounter: Payer: Self-pay | Admitting: Obstetrics and Gynecology

## 2020-01-28 LAB — COMPREHENSIVE METABOLIC PANEL
ALT: 67 IU/L — ABNORMAL HIGH (ref 0–32)
AST: 35 IU/L (ref 0–40)
Albumin/Globulin Ratio: 1.7 (ref 1.2–2.2)
Albumin: 4.3 g/dL (ref 3.8–4.8)
Alkaline Phosphatase: 109 IU/L (ref 39–117)
BUN/Creatinine Ratio: 16 (ref 9–23)
BUN: 11 mg/dL (ref 6–20)
Bilirubin Total: 0.3 mg/dL (ref 0.0–1.2)
CO2: 24 mmol/L (ref 20–29)
Calcium: 9.5 mg/dL (ref 8.7–10.2)
Chloride: 104 mmol/L (ref 96–106)
Creatinine, Ser: 0.68 mg/dL (ref 0.57–1.00)
GFR calc Af Amer: 132 mL/min/{1.73_m2} (ref >59)
GFR calc non Af Amer: 114 mL/min/{1.73_m2} (ref >59)
Globulin, Total: 2.5 g/dL (ref 1.5–4.5)
Glucose: 68 mg/dL (ref 65–99)
Potassium: 4.1 mmol/L (ref 3.5–5.2)
Sodium: 144 mmol/L (ref 134–144)
Total Protein: 6.8 g/dL (ref 6.0–8.5)

## 2020-01-28 LAB — GLUCOSE TOLERANCE, 2 HOURS
Glucose, 2 hour: 74 mg/dL (ref 65–139)
Glucose, GTT - Fasting: 87 mg/dL (ref 65–99)

## 2020-02-01 ENCOUNTER — Telehealth: Payer: Self-pay | Admitting: *Deleted

## 2020-02-01 NOTE — Telephone Encounter (Signed)
Emily Webb called with a question and transferred to nurse. She wants to know if she can get the Covid vaccine even though she is postpartum and breastfeeding. I discssed with Dr. Jolayne Panther and she may get the covid vaccine if she chooses to when she is eligible based on her age/ risk factors/ postpartum/ breastfeeding. I informed Tawni she may get the covid vaccine if she chooses even though she is postpartum and breastfeeding when she is eligible . She voices understanding. Schon Zeiders,RN

## 2020-02-03 ENCOUNTER — Telehealth: Payer: 59

## 2020-02-08 ENCOUNTER — Telehealth (INDEPENDENT_AMBULATORY_CARE_PROVIDER_SITE_OTHER): Payer: 59

## 2020-02-08 DIAGNOSIS — Z0130 Encounter for examination of blood pressure without abnormal findings: Secondary | ICD-10-CM

## 2020-02-08 NOTE — Progress Notes (Signed)
I connected with  Emily Webb on 02/08/20 at  2:30 PM EDT by telephone and verified that I am speaking with the correct person using two identifiers.   Visit today is for blood pressure check following dc of Procardia XL 30 mg daily on 01/27/20. Pt reports she has been checking her BP twice a day since PP appt with [place], MD. Reports that her BPs have been 130s/80s and 120s/70s. BP yesterday was 130/88. BP today was 125/76. BPs reviewed with Thressa Sheller, NP who states that patient may stop checking BP. Mathews Robinsons, CNM states that in the event that pt checks BP and finds it to be high, pt should follow up with a primary care provider for any further BP management  Provider recommendation reviewed with pt. Pt states she has recently moved to [place], [place] and would like help finding a PCP. Discussed how to schedule a new pt appt and office information for two PCPs accepting new pts sent via MyChart. Pt states she received a message from [place], [place] regarding gastroenterology referral. Pt declines referral at this time and states she would prefer to follow up with [facility] lab in 2-3 months. I explained to pt she may return to our office or communicate this need to her new PCP. Explained that pt will need to sign ROI at the new office so that they may obtain her records. Pt verbalized understanding.   Marjo Bicker, RN 02/08/2020  2:31 PM

## 2020-02-08 NOTE — Progress Notes (Signed)
Patient seen and assessed by nursing staff during this encounter. I have reviewed the chart and agree with the documentation and plan. I have also made any necessary editorial changes.  Thressa Sheller DNP, CNM  02/08/20  3:43 PM

## 2020-02-14 ENCOUNTER — Encounter: Payer: Self-pay | Admitting: *Deleted

## 2020-02-29 NOTE — Hospital Course (Signed)
Chief Complaint  Patient presents with   Hypertension       First Provider Initiated Contact with Patient 12/30/19 1630       S: Emily Webb  is a 34 y.o. y.o. year old [id number] female at 2 weeks post partum who presents to [facility] with elevated blood pressures. Denies Hx of hypertension. Current blood pressure medication: n/a.  She was seen in the office ([facility]) today for BP check due to reporting headache. Has had a constant headache since Tuesday. Headache in back of head to neck. Nothing makes better or worse. Has not treated symptoms. No photophobia or phonophobia. Symptoms not affected by position changes. Denies visual disturbance or epigastric pain.    O:  Patient Vitals for the past 24 hrs:   BP Temp Temp src Pulse Resp SpO2 Weight  12/30/19 1745 (!) 144/96 -- -- (!) 59 -- 99 % --  12/30/19 1730 (!) 138/92 -- -- 64 -- -- --  12/30/19 1715 (!) 152/107 -- -- 76 -- -- --  12/30/19 1700 (!) 156/96 -- -- 69 -- -- --  12/30/19 1645 (!) 158/97 -- -- 70 -- -- --  12/30/19 1630 (!) 159/108 -- -- 79 -- -- --  12/30/19 1625 (!) 160/107 -- -- 77 -- -- --  12/30/19 1607 (!) 159/104 97.9 F (36.6 C) Oral 82 16 97 % --  12/30/19 1558 -- -- -- -- -- -- 62.3 kg    General: NAD Heart: Regular rate Lungs: Normal rate and effort Abd: Soft, NT, Gravid, S=D Extremities: No Pedal edema Neuro: 2+ deep tendon reflexes, No clonus       Lab Results Last 24 Hours       Results for orders placed or performed during the hospital encounter of 12/30/19 (from the past 24 hour(s))  CBC     Status: None    Collection Time: 12/30/19  4:49 PM  Result Value Ref Range    WBC 9.6 4.0 - 10.5 K/uL    RBC 4.26 3.87 - 5.11 MIL/uL    Hemoglobin 12.8 12.0 - 15.0 g/dL    HCT 69.6 29.5 - 28.4 %    MCV 94.1 80.0 - 100.0 fL    MCH 30.0 26.0 - 34.0 pg    MCHC 31.9 30.0 - 36.0 g/dL    RDW 13.2 44.0 - 10.2 %    Platelets 268 150 - 400 K/uL    nRBC 0.0 0.0 - 0.2 %  Comprehensive metabolic panel     Status:  Abnormal    Collection Time: 12/30/19  4:49 PM  Result Value Ref Range    Sodium 138 135 - 145 mmol/L    Potassium 4.0 3.5 - 5.1 mmol/L    Chloride 106 98 - 111 mmol/L    CO2 23 22 - 32 mmol/L    Glucose, Bld 122 (H) 70 - 99 mg/dL    BUN 15 6 - 20 mg/dL    Creatinine, Ser 7.25 0.44 - 1.00 mg/dL    Calcium 8.6 (L) 8.9 - 10.3 mg/dL    Total Protein 6.1 (L) 6.5 - 8.1 g/dL    Albumin 3.2 (L) 3.5 - 5.0 g/dL    AST 52 (H) 15 - 41 U/L    ALT 84 (H) 0 - 44 U/L    Alkaline Phosphatase 92 38 - 126 U/L    Total Bilirubin 0.6 0.3 - 1.2 mg/dL    GFR calc non Af Amer >60 >60 mL/min    GFR calc Af  Amer >60 >60 mL/min    Anion gap 9 5 - 15        MDM Severe range BP x 1. Rest of BPs elevated but not severe range.  Tylenol & flexeril given for headache with good response, bringing pain score down to 2/10.  Platelets normal but LFTs elevated. C/w Dr. Ilda Basset & will admit for mag sulfate & BP control.    A:  Postpartum preeclampsia   P:  Admit to obsc unit Start procardia xl 30 mg daily Mag sulfate 4 gm bolus f/b 2 gm/hour Dr. Ilda Basset to speak with patient regarding plan   Jorje Guild, NP 12/30/2019 5:39 PM*Admit to [facility]. Breast feeding *Severe pre-eclampsia (HA, BP, elevated LFTs): d/w pt that recommend 24hrs Mg, start procardia xl 30 qday and rpt labs in am. *PPx: lovenox *Dispo: hopefully 2/13

## 2020-03-22 ENCOUNTER — Other Ambulatory Visit: Payer: Self-pay | Admitting: Obstetrics & Gynecology

## 2020-04-24 ENCOUNTER — Telehealth: Payer: Self-pay

## 2020-04-24 NOTE — Telephone Encounter (Signed)
Pt reports that she is having irregular menstrual.   Called pt unable to leave message due to voicemail box not set up.    Addison Naegeli, RN

## 2021-01-15 ENCOUNTER — Other Ambulatory Visit: Payer: Self-pay | Admitting: Nurse Practitioner
